# Patient Record
Sex: Female | Born: 1975 | Race: Black or African American | Hispanic: No | Marital: Single | Smoking: Never smoker
Health system: Southern US, Community
[De-identification: ages and names within clinical notes are randomized; demographics above are authoritative.]

## PROBLEM LIST (undated history)

## (undated) DIAGNOSIS — IMO0001 Reserved for inherently not codable concepts without codable children: Secondary | ICD-10-CM

---

## 2011-04-09 ENCOUNTER — Inpatient Hospital Stay (INDEPENDENT_AMBULATORY_CARE_PROVIDER_SITE_OTHER)
Admission: RE | Admit: 2011-04-09 | Discharge: 2011-04-09 | Disposition: A | Discharge status: Home / Self Care | Payer: Self-pay | Source: Ambulatory Visit | Attending: Family Medicine | Admitting: Family Medicine

## 2011-04-09 DIAGNOSIS — T148 Other injury of unspecified body region: Secondary | ICD-10-CM

## 2011-07-03 ENCOUNTER — Inpatient Hospital Stay (INDEPENDENT_AMBULATORY_CARE_PROVIDER_SITE_OTHER)
Admission: RE | Admit: 2011-07-03 | Discharge: 2011-07-03 | Disposition: A | Discharge status: Home / Self Care | Payer: Self-pay | Source: Ambulatory Visit | Attending: Emergency Medicine | Admitting: Emergency Medicine

## 2011-07-03 DIAGNOSIS — N949 Unspecified condition associated with female genital organs and menstrual cycle: Secondary | ICD-10-CM

## 2011-07-03 LAB — POCT URINALYSIS DIP (DEVICE)
Bilirubin Urine: NEGATIVE
Ketones, ur: NEGATIVE mg/dL
Nitrite: NEGATIVE
Protein, ur: NEGATIVE mg/dL
pH: 7 (ref 5.0–8.0)

## 2011-07-03 LAB — POCT PREGNANCY, URINE: Preg Test, Ur: NEGATIVE

## 2011-07-03 LAB — WET PREP, GENITAL: Trich, Wet Prep: NONE SEEN

## 2011-07-04 LAB — GC/CHLAMYDIA PROBE AMP, GENITAL: Chlamydia, DNA Probe: NEGATIVE

## 2011-08-02 ENCOUNTER — Inpatient Hospital Stay (INDEPENDENT_AMBULATORY_CARE_PROVIDER_SITE_OTHER)
Admission: RE | Admit: 2011-08-02 | Discharge: 2011-08-02 | Disposition: A | Discharge status: Home / Self Care | Payer: Self-pay | Source: Ambulatory Visit | Attending: Family Medicine | Admitting: Family Medicine

## 2011-08-02 DIAGNOSIS — L259 Unspecified contact dermatitis, unspecified cause: Secondary | ICD-10-CM

## 2012-02-15 ENCOUNTER — Encounter (HOSPITAL_COMMUNITY): Payer: Self-pay | Admitting: Emergency Medicine

## 2012-02-15 ENCOUNTER — Emergency Department (HOSPITAL_COMMUNITY)
Admission: EM | Admit: 2012-02-15 | Discharge: 2012-02-15 | Disposition: A | Discharge status: Home Health | Payer: Self-pay | Attending: Emergency Medicine | Admitting: Emergency Medicine

## 2012-02-15 DIAGNOSIS — R51 Headache: Secondary | ICD-10-CM | POA: Insufficient documentation

## 2012-02-15 DIAGNOSIS — J3489 Other specified disorders of nose and nasal sinuses: Secondary | ICD-10-CM | POA: Insufficient documentation

## 2012-02-15 DIAGNOSIS — IMO0001 Reserved for inherently not codable concepts without codable children: Secondary | ICD-10-CM | POA: Insufficient documentation

## 2012-02-15 DIAGNOSIS — J0100 Acute maxillary sinusitis, unspecified: Secondary | ICD-10-CM | POA: Insufficient documentation

## 2012-02-15 HISTORY — DX: Reserved for inherently not codable concepts without codable children: IMO0001

## 2012-02-15 MED ORDER — TRAMADOL HCL 50 MG PO TABS: 50.0000 mg | ORAL_TABLET | Freq: Four times a day (QID) | ORAL | Status: AC | PRN

## 2012-02-15 MED ORDER — IBUPROFEN 800 MG PO TABS
800.0000 mg | ORAL_TABLET | Freq: Once | ORAL | Status: AC
  Administered 2012-02-15: 800 mg via ORAL
  Filled 2012-02-15: qty 1

## 2012-02-15 MED ORDER — IBUPROFEN 600 MG PO TABS: 600.0000 mg | ORAL_TABLET | Freq: Four times a day (QID) | ORAL | Status: AC | PRN

## 2012-02-15 MED ORDER — MOMETASONE FUROATE 50 MCG/ACT NA SUSP: 2.0000 | Freq: Every day | NASAL | Status: DC

## 2012-02-15 MED ORDER — OXYMETAZOLINE HCL 0.05 % NA SOLN
1.0000 | Freq: Once | NASAL | Status: AC
  Administered 2012-02-15: 1 via NASAL
  Filled 2012-02-15: qty 15

## 2012-02-15 NOTE — ED Notes (Signed)
Patient complaining of right facial pain and sinus pressure, with the pain extending into her right ear.  Patient states that she has had recent cold symptoms and sinus pressure the last few days, and that the right sided facial pain and ear ache became worse today.

## 2012-02-15 NOTE — ED Notes (Signed)
Patient is AOx4 and comfortable with her discharge instructions. 

## 2012-02-15 NOTE — Discharge Instructions (Signed)
Take Motrin (maximum of 2400mg  per day) or tylenol (maximum of 4000mg  per day) scheduled for 2 days then as needed for pain.  If your pain persists, take Tramadol.  Use nasonex daily and Afrin up to twice daily for 3 days; however, do not use Afrin for longer than 3 days.     Sinusitis Sinuses are air pockets within the bones of your face. The growth of bacteria within a sinus leads to infection. The infection prevents the sinuses from draining. This infection is called sinusitis. SYMPTOMS  There will be different areas of pain depending on which sinuses have become infected.  The maxillary sinuses often produce pain beneath the eyes.   Frontal sinusitis may cause pain in the middle of the forehead and above the eyes.  Other problems (symptoms) include:  Toothaches.   Colored, pus-like (purulent) drainage from the nose.   Swelling, warmth, and tenderness over the sinus areas may be signs of infection.  TREATMENT  Sinusitis is most often determined by an exam.X-rays may be taken. If x-rays have been taken, make sure you obtain your results or find out how you are to obtain them. Your caregiver may give you medications (antibiotics). These are medications that will help kill the bacteria causing the infection. You may also be given a medication (decongestant) that helps to reduce sinus swelling.  HOME CARE INSTRUCTIONS   Only take over-the-counter or prescription medicines for pain, discomfort, or fever as directed by your caregiver.   Drink extra fluids. Fluids help thin the mucus so your sinuses can drain more easily.   Applying either moist heat or ice packs to the sinus areas may help relieve discomfort.   Use saline nasal sprays to help moisten your sinuses. The sprays can be found at your local drugstore.  SEEK IMMEDIATE MEDICAL CARE IF:  You have a fever.   You have increasing pain, severe headaches, or toothache.   You have nausea, vomiting, or drowsiness.   You develop  unusual swelling around the face or trouble seeing.  MAKE SURE YOU:   Understand these instructions.   Will watch your condition.   Will get help right away if you are not doing well or get worse.  Document Released: 12/12/2005 Document Revised: 08/24/2011 Document Reviewed: 07/11/2007 [facility] Patient Information 2012 [place], [place].

## 2012-02-15 NOTE — ED Provider Notes (Signed)
History     CSN: [id number]  Arrival date & time 02/15/12  2020   First MD Initiated Contact with Patient 02/15/12 2148      Chief Complaint  Patient presents with  . Facial Pain    (Consider location/radiation/quality/duration/timing/severity/associated sxs/prior treatment) HPI History provided by the patient.  36 year old female presenting with complaint of right-sided face pain.  Patient states that her pain began yesterday morning and has been gradual in onset, constant, and moderate to severe. Pain is located only on the right side of her face and extends back to around her right ear.  Pain is described as throbbing with occasional sharp sensation.  Patient reports mild improvement with over-the-counter cough and cold medications. Patient reports associated mild right-sided sinus pressure which has largely resolved prior to ED presentation. No associated rash, fever, vision change, hearing change, sore throat, rhinorrhea, or cough. Patient reports that she has a history of tension headaches but this is different both in character and location, as her headaches are generally bilateral frontal.  Pain is also somewhat different than prior sinusitis.    Patient has not been seen recently for her current complaints, and patient does not have a PCP.     Past Medical History  Diagnosis Date  . No significant past medical history     History reviewed. No pertinent past surgical history.  History reviewed. No pertinent family history.  History  Substance Use Topics  . Smoking status: Never Smoker   . Smokeless tobacco: Not on file  . Alcohol Use: No    OB History    Grav Para Term Preterm Abortions TAB SAB Ect Mult Living                  Review of Systems  Constitutional: Negative for fever and chills.  HENT: Positive for ear pain (Rt-sided) and sinus pressure (Rt). Negative for hearing loss, congestion, sore throat, rhinorrhea, trouble swallowing, neck pain and dental  problem.   Eyes: Negative for photophobia, pain and visual disturbance.  Respiratory: Negative for cough, shortness of breath and wheezing.   Cardiovascular: Negative for chest pain and palpitations.  Gastrointestinal: Negative for nausea, vomiting, abdominal pain, diarrhea and blood in stool.  Genitourinary: Negative for dysuria and hematuria.  Musculoskeletal: Negative for back pain and gait problem.  Skin: Negative for rash and wound.  Neurological: Positive for headaches. Negative for dizziness, facial asymmetry, speech difficulty, weakness and numbness.  Psychiatric/Behavioral: Negative for confusion and agitation.  All other systems reviewed and are negative.    Allergies  Review of patient's allergies indicates no known allergies.  Home Medications  No current outpatient prescriptions on file.  BP 154/97  Pulse 96  Temp(Src) 98.2 F (36.8 C) (Oral)  Resp 20  SpO2 100%  Physical Exam  Nursing note and vitals reviewed. Constitutional: She is oriented to person, place, and time. She appears well-developed and well-nourished.       Appears uncomfortable but in no acute distress  HENT:  Head: Normocephalic and atraumatic.  Right Ear: External ear normal.  Left Ear: External ear normal.  Nose: Nose normal.  Mouth/Throat: Oropharynx is clear and moist.       Normal TMs bilaterally, no mastoid tenderness, and no pain elicited with movement of the right auricle, no rash or sign of shingles, no significantly abnormal tonsils or exudate. No dental abscess or sig dental TTP.  Eyes: Conjunctivae and EOM are normal. Pupils are equal, round, and reactive to light.  Neck: Normal  range of motion. Neck supple.  Cardiovascular: Normal rate, regular rhythm and intact distal pulses.   No murmur heard. Pulmonary/Chest: Effort normal and breath sounds normal. No respiratory distress.  Abdominal: Soft. Bowel sounds are normal. There is no tenderness.  Musculoskeletal: Normal range of  motion. She exhibits no edema.  Neurological: She is alert and oriented to person, place, and time.  Skin: Skin is warm and dry. No rash noted. She is not diaphoretic.  Psychiatric: She has a normal mood and affect. Judgment normal.    ED Course  Procedures (including critical care time)  Labs Reviewed - No data to display No results found.   1. Maxillary sinusitis, acute       MDM  36 year old female presenting with complaint of right-sided face to head pain.  Exam as above.  Doubt glaucoma, dental etiology, trigeminal neuralgia, shingles, AOM or otitis externa, temporal arteritis, or mastoiditis.  No h/o migraines.  Right maxillary sinusitis probable.  Will treat with Afrin, nasonex, and pain meds.  Return precautions reviewed.      Particia Lather, MD 02/16/12 [phone]

## 2012-02-16 NOTE — ED Notes (Signed)
[facility] pharmacy states pt says nasonex is too expensive; ok to substitute flonase spray one unit, 2 sprays each nostril every day for 3-5 days per bowie tran, pa-c.

## 2012-02-16 NOTE — ED Provider Notes (Signed)
I saw and evaluated the patient, reviewed the resident's note and I agree with the findings and plan.   Loren Racer, MD 02/16/12 1600

## 2012-06-07 ENCOUNTER — Encounter (HOSPITAL_COMMUNITY): Payer: Self-pay | Admitting: Emergency Medicine

## 2012-06-07 ENCOUNTER — Emergency Department (HOSPITAL_COMMUNITY)
Admission: EM | Admit: 2012-06-07 | Discharge: 2012-06-07 | Disposition: A | Discharge status: Home / Self Care | Payer: Self-pay | Attending: Emergency Medicine | Admitting: Emergency Medicine

## 2012-06-07 DIAGNOSIS — J320 Chronic maxillary sinusitis: Secondary | ICD-10-CM | POA: Insufficient documentation

## 2012-06-07 DIAGNOSIS — J329 Chronic sinusitis, unspecified: Secondary | ICD-10-CM

## 2012-06-07 MED ORDER — OXYMETAZOLINE HCL 0.05 % NA SOLN: 2.0000 | Freq: Two times a day (BID) | NASAL | Status: AC

## 2012-06-07 MED ORDER — HYDROCODONE-ACETAMINOPHEN 5-325 MG PO TABS: 1.0000 | ORAL_TABLET | ORAL | Status: AC | PRN

## 2012-06-07 MED ORDER — IBUPROFEN 800 MG PO TABS
800.0000 mg | ORAL_TABLET | Freq: Once | ORAL | Status: AC
  Administered 2012-06-07: 800 mg via ORAL

## 2012-06-07 MED ORDER — CEPHALEXIN 500 MG PO CAPS: 500.0000 mg | ORAL_CAPSULE | Freq: Four times a day (QID) | ORAL | Status: AC

## 2012-06-07 MED ORDER — IBUPROFEN 800 MG PO TABS
ORAL_TABLET | ORAL | Status: AC
  Filled 2012-06-07: qty 1

## 2012-06-07 NOTE — ED Provider Notes (Signed)
History     CSN: [id number]  Arrival date & time 06/07/12  0009   First MD Initiated Contact with Patient 06/07/12 0058      Chief Complaint  Patient presents with  . Facial Pain    (Consider location/radiation/quality/duration/timing/severity/associated sxs/prior treatment) HPI Comments: Patient here with recurrent right sided facial pain - states pain starts in her right ear and radiates to right maxillary sinus and into upper jaw.  States she was seen in February for this, placed on nasonex and pain medication which did not help.  She has not seen a PCP or ENT for this.  Denies purulent drainage, reports pain increases with lying flat, denies fever, chills, nausea, vomiting.  Patient is a 36 y.o. female presenting with tooth pain. The history is provided by the patient. No language interpreter was used.  Dental PainThe primary symptoms include mouth pain. Primary symptoms do not include dental injury, oral bleeding, oral lesions, headaches, fever, shortness of breath, sore throat, angioedema or cough. The symptoms began more than 1 week ago. The symptoms are worsening. The symptoms are new. The symptoms occur constantly.  Additional symptoms include: jaw pain and ear pain. Additional symptoms do not include: dental sensitivity to temperature, gum swelling, gum tenderness, purulent gums, trismus, facial swelling, trouble swallowing, pain with swallowing, excessive salivation, dry mouth, taste disturbance, smell disturbance, drooling, hearing loss, nosebleeds, swollen glands, goiter and fatigue.    Past Medical History  Diagnosis Date  . No significant past medical history     History reviewed. No pertinent past surgical history.  No family history on file.  History  Substance Use Topics  . Smoking status: Never Smoker   . Smokeless tobacco: Not on file  . Alcohol Use: No    OB History    Grav Para Term Preterm Abortions TAB SAB Ect Mult Living                  Review of  Systems  Constitutional: Negative for fever and fatigue.  HENT: Positive for ear pain and sinus pressure. Negative for hearing loss, nosebleeds, sore throat, facial swelling, rhinorrhea, drooling and trouble swallowing.   Respiratory: Negative for cough and shortness of breath.   Neurological: Negative for headaches.  All other systems reviewed and are negative.    Allergies  Review of patient's allergies indicates no known allergies.  Home Medications   Current Outpatient Rx  Name Route Sig Dispense Refill  . IBUPROFEN 800 MG PO TABS Oral Take 800 mg by mouth every 8 (eight) hours as needed.    . MOMETASONE FUROATE 50 MCG/ACT NA SUSP Nasal Place 2 sprays into the nose daily. 17 g 0  . OVER THE COUNTER MEDICATION Oral Take 1 tablet by mouth every 4 (four) hours as needed. OTC allergy medication    . OVER THE COUNTER MEDICATION Oral Take 1 tablet by mouth daily as needed. excedrin-type product OTC      BP 139/96  Pulse 96  Temp 98.4 F (36.9 C) (Oral)  Resp 16  SpO2 96%  Physical Exam  Nursing note and vitals reviewed. Constitutional: She is oriented to person, place, and time. She appears well-developed and well-nourished. No distress.  HENT:  Head: Normocephalic and atraumatic.  Right Ear: External ear normal.  Left Ear: External ear normal.  Mouth/Throat: Oropharynx is clear and moist. No oropharyngeal exudate.       Boggy nasal mucosa - ttp to right maxillary sinus, no rash or lesions noted.  Eyes: Conjunctivae  are normal. Pupils are equal, round, and reactive to light. No scleral icterus.  Neck: Normal range of motion. Neck supple.  Cardiovascular: Normal rate, regular rhythm and normal heart sounds.  Exam reveals no gallop and no friction rub.   No murmur heard. Pulmonary/Chest: Effort normal and breath sounds normal. No respiratory distress. She has no wheezes. She has no rales. She exhibits no tenderness.  Abdominal: Soft. Bowel sounds are normal. She exhibits no  distension. There is no tenderness.  Musculoskeletal: Normal range of motion. She exhibits no edema and no tenderness.  Lymphadenopathy:    She has no cervical adenopathy.  Neurological: She is alert and oriented to person, place, and time. No cranial nerve deficit.  Skin: Skin is warm and dry. No rash noted. No erythema. No pallor.  Psychiatric: She has a normal mood and affect. Her behavior is normal. Judgment and thought content normal.    ED Course  Procedures (including critical care time)  Labs Reviewed - No data to display No results found.   Maxillary sinusitis   MDM  Patient with history of maxillary sinusitis here with similar symptoms.  Will place on short course of pain medication, afrin nasal spray and abx - patient will follow up with Dr. Lazarus Salines if needed.        Izola Price [place], [place] 06/07/12 0122

## 2012-06-07 NOTE — ED Provider Notes (Signed)
Medical screening examination/treatment/procedure(s) were performed by non-physician practitioner and as supervising physician I was immediately available for consultation/collaboration.  Jasmine Awe, MD 06/07/12 [phone]

## 2012-06-07 NOTE — ED Notes (Signed)
Pt. Alert and oriented, discharged to home

## 2012-06-07 NOTE — Discharge Instructions (Signed)

## 2012-06-07 NOTE — ED Notes (Signed)
C/o sinus pressure x 2 weeks.  Taking multiple OTC medications without relief.

## 2012-06-07 NOTE — ED Notes (Signed)
Received pt. From triage pt. Alert and oriented, NAD noted 

## 2012-12-24 ENCOUNTER — Emergency Department (HOSPITAL_COMMUNITY)
Admission: EM | Admit: 2012-12-24 | Discharge: 2012-12-25 | Disposition: A | Discharge status: Home / Self Care | Payer: Self-pay | Attending: Emergency Medicine | Admitting: Emergency Medicine

## 2012-12-24 ENCOUNTER — Encounter (HOSPITAL_COMMUNITY): Payer: Self-pay | Admitting: *Deleted

## 2012-12-24 DIAGNOSIS — R05 Cough: Secondary | ICD-10-CM | POA: Insufficient documentation

## 2012-12-24 DIAGNOSIS — R5381 Other malaise: Secondary | ICD-10-CM | POA: Insufficient documentation

## 2012-12-24 DIAGNOSIS — R059 Cough, unspecified: Secondary | ICD-10-CM | POA: Insufficient documentation

## 2012-12-24 DIAGNOSIS — R52 Pain, unspecified: Secondary | ICD-10-CM | POA: Insufficient documentation

## 2012-12-24 DIAGNOSIS — R509 Fever, unspecified: Secondary | ICD-10-CM | POA: Insufficient documentation

## 2012-12-24 DIAGNOSIS — J3489 Other specified disorders of nose and nasal sinuses: Secondary | ICD-10-CM | POA: Insufficient documentation

## 2012-12-24 DIAGNOSIS — J069 Acute upper respiratory infection, unspecified: Secondary | ICD-10-CM | POA: Insufficient documentation

## 2012-12-24 NOTE — ED Notes (Signed)
The pt has headache all day with body aches and a temp.  She took tylenol 650mg  po  ??? Dose of tablest

## 2012-12-25 MED ORDER — HYDROCODONE-ACETAMINOPHEN 5-325 MG PO TABS: 1.0000 | ORAL_TABLET | ORAL | Status: DC | PRN

## 2012-12-25 NOTE — ED Notes (Signed)
Pt reports being around her niece Saturday who sick with nasal congestion and cough

## 2012-12-25 NOTE — ED Provider Notes (Signed)
History     CSN: [id number]  Arrival date & time 12/24/12  2211   First MD Initiated Contact with Patient 12/25/12 0004      Chief Complaint  Patient presents with  . Headache    (Consider location/radiation/quality/duration/timing/severity/associated sxs/prior treatment) HPI History provided by pt.   Pt presents w/ 2 days of cough and fever.  Associated w/ frontal headache, nasal congestion, body aches, generalized weakness and chills.  Denies CP, SOB, sore throat and N/V/D.  Known sick contacts.  Has taken tylenol w/ some relief.  No PMH.  Past Medical History  Diagnosis Date  . No significant past medical history     History reviewed. No pertinent past surgical history.  No family history on file.  History  Substance Use Topics  . Smoking status: Never Smoker   . Smokeless tobacco: Not on file  . Alcohol Use: No    OB History    Grav Para Term Preterm Abortions TAB SAB Ect Mult Living                  Review of Systems  All other systems reviewed and are negative.    Allergies  Review of patient's allergies indicates no known allergies.  Home Medications   Current Outpatient Rx  Name  Route  Sig  Dispense  Refill  . HYDROCODONE-ACETAMINOPHEN 5-325 MG PO TABS   Oral   Take 1 tablet by mouth every 4 (four) hours as needed for pain.   12 tablet   0   . IBUPROFEN 800 MG PO TABS   Oral   Take 800 mg by mouth every 8 (eight) hours as needed.         . MOMETASONE FUROATE 50 MCG/ACT NA SUSP   Nasal   Place 2 sprays into the nose daily.   17 g   0   . OVER THE COUNTER MEDICATION   Oral   Take 1 tablet by mouth every 4 (four) hours as needed. OTC allergy medication         . OVER THE COUNTER MEDICATION   Oral   Take 1 tablet by mouth daily as needed. excedrin-type product OTC           BP 126/86  Pulse 120  Temp 100.5 F (38.1 C) (Oral)  Resp 22  SpO2 96%  Physical Exam  Nursing note and vitals reviewed. Constitutional: She is  oriented to person, place, and time. She appears well-developed and well-nourished.  HENT:  Head: Normocephalic and atraumatic.       No tenderness of sinuses or temples.  Nasal congestion.  Nml posterior pharynx and tonsils.   Eyes:       Normal appearance  Neck: Normal range of motion. Neck supple. No rigidity. No Brudzinski's sign and no Kernig's sign noted.  Cardiovascular: Normal rate, regular rhythm and intact distal pulses.   Pulmonary/Chest: Effort normal and breath sounds normal.  Musculoskeletal: Normal range of motion.  Lymphadenopathy:    She has cervical adenopathy.  Neurological: She is alert and oriented to person, place, and time. No sensory deficit. Coordination normal.       CN 3-12 intact.  No nystagmus.  5/5 and equal upper and lower extremity strength.  No past pointing.    Skin: Skin is warm and dry. No rash noted.  Psychiatric: She has a normal mood and affect. Her behavior is normal.    ED Course  Procedures (including critical care time)  Labs Reviewed -  No data to display No results found.   1. Viral URI with cough       MDM  36yo healthy F presents w/ s/sx most consistent w/ viral URI.  Currently afebrile, VS w/in nml range, no respiratory distress or focal neuro deficits.  Will treat symptomatically; prescribed vicodin for pain/cough suppression and recommended ibuprofen, sudafed, rest and fluids.  Return precautions discussed.         Otilio Miu, PA-C 12/25/12 [phone]

## 2012-12-25 NOTE — ED Provider Notes (Signed)
Medical screening examination/treatment/procedure(s) were performed by non-physician practitioner and as supervising physician I was immediately available for consultation/collaboration.  Sunnie Nielsen, MD 12/25/12 [phone]

## 2013-03-06 ENCOUNTER — Emergency Department (INDEPENDENT_AMBULATORY_CARE_PROVIDER_SITE_OTHER)
Admission: EM | Admit: 2013-03-06 | Discharge: 2013-03-06 | Disposition: A | Discharge status: Home / Self Care | Payer: Self-pay | Source: Home / Self Care

## 2013-03-06 ENCOUNTER — Encounter (HOSPITAL_COMMUNITY): Payer: Self-pay | Admitting: *Deleted

## 2013-03-06 LAB — POCT PREGNANCY, URINE: Preg Test, Ur: NEGATIVE

## 2013-03-06 MED ORDER — CEPHALEXIN 500 MG PO CAPS: 500.0000 mg | ORAL_CAPSULE | Freq: Four times a day (QID) | ORAL | Status: DC

## 2013-03-06 MED ORDER — FLUCONAZOLE 150 MG PO TABS: ORAL_TABLET | ORAL | Status: DC

## 2013-03-06 MED ORDER — HYDROCODONE-ACETAMINOPHEN 5-325 MG PO TABS: 1.0000 | ORAL_TABLET | ORAL | Status: DC | PRN

## 2013-03-06 NOTE — ED Notes (Signed)
C/o hematuria onset Monday.  Also has burning and frequency of urination.  C/o itching on her labia and some on the inside of her vagina.  C/o lower abd. cramping onset Monday night.  Aching in her back and all over on Tuesday with a sore throat.  States her sinuses are draining since Monday night and Tuesday.

## 2013-03-06 NOTE — ED Provider Notes (Signed)
History     CSN: [id number]  Arrival date & time 03/06/13  1858   None     Chief Complaint  Patient presents with  . Hematuria    (Consider location/radiation/quality/duration/timing/severity/associated sxs/prior treatment) HPI Comments: 37 year old female noticed blood in the urine 48 hours ago. She then experienced dysuria, suprapubic discomfort and urinary frequency. In addition she has developed a sharp pain that radiates from the right flank toward the right lower abdomen. It is intermittent and then described as a dull ache alternating with sharp.. denies fever or other constitutional symptoms. Also complaining of PND with mild sore throat.  Patient is a 37 y.o. female presenting with hematuria.  Hematuria Associated symptoms include abdominal pain.    Past Medical History  Diagnosis Date  . No significant past medical history     History reviewed. No pertinent past surgical history.  History reviewed. No pertinent family history.  History  Substance Use Topics  . Smoking status: Never Smoker   . Smokeless tobacco: Not on file  . Alcohol Use: No    OB History   Grav Para Term Preterm Abortions TAB SAB Ect Mult Living                  Review of Systems  Constitutional: Positive for activity change. Negative for fever and fatigue.  Respiratory: Negative.   Cardiovascular: Negative.   Gastrointestinal: Positive for abdominal pain. Negative for nausea.  Genitourinary: Positive for dysuria, frequency, hematuria and flank pain.  Musculoskeletal: Positive for back pain.  Skin: Negative.   Neurological: Negative.     Allergies  Review of patient's allergies indicates no known allergies.  Home Medications   Current Outpatient Rx  Name  Route  Sig  Dispense  Refill  . OVER THE COUNTER MEDICATION   Oral   Take 1 tablet by mouth every 4 (four) hours as needed. OTC allergy medication         . cephALEXin (KEFLEX) 500 MG capsule   Oral   Take 1 capsule  (500 mg total) by mouth 4 (four) times daily.   28 capsule   0   . fluconazole (DIFLUCAN) 150 MG tablet      1 tab po x 1. May repeat in 72 hours if no improvement   2 tablet   0   . HYDROcodone-acetaminophen (NORCO/VICODIN) 5-325 MG per tablet   Oral   Take 1 tablet by mouth every 4 (four) hours as needed for pain.   12 tablet   0   . HYDROcodone-acetaminophen (NORCO/VICODIN) 5-325 MG per tablet   Oral   Take 1 tablet by mouth every 4 (four) hours as needed for pain.   15 tablet   0   . ibuprofen (ADVIL,MOTRIN) 800 MG tablet   Oral   Take 800 mg by mouth every 8 (eight) hours as needed.         . mometasone (NASONEX) 50 MCG/ACT nasal spray   Nasal   Place 2 sprays into the nose daily.   17 g   0   . OVER THE COUNTER MEDICATION   Oral   Take 1 tablet by mouth daily as needed. excedrin-type product OTC           BP 115/74  Pulse 107  Temp(Src) 98.8 F (37.1 C) (Oral)  Resp 18  SpO2 99%  Physical Exam  Nursing note and vitals reviewed. Constitutional: She is oriented to person, place, and time. She appears well-developed and well-nourished. No distress.  HENT:  Right Ear: External ear normal.  Left Ear: External ear normal.  Minor erythema with clear PND in the oropharynx.  Neck: Neck supple.  Cardiovascular: Normal rate and normal heart sounds.   Pulmonary/Chest: Effort normal.  Abdominal: Soft. She exhibits no distension and no mass. There is no tenderness. There is no rebound and no guarding.  Musculoskeletal: Normal range of motion.  Neurological: She is alert and oriented to person, place, and time.  Skin: Skin is warm and dry.  Psychiatric: She has a normal mood and affect.    ED Course  Procedures (including crit she is a third year resident the ical care time)  Labs Reviewed  POCT PREGNANCY, URINE   No results found.  Results for orders placed during the hospital encounter of 03/06/13  POCT PREGNANCY, URINE      Result Value Range    Preg Test, Ur NEGATIVE  NEGATIVE      1. UTI (lower urinary tract infection)   2. Flank pain   3. PND (post-nasal drip)       MDM  Norco 5 mg every 4-6 hours when necessary pain . Diflucan 150 mg one now and repeat in 48 hours need to obtain a primary care doctor for followup. It was discussed in detail regarding the possibility of having a kidney stone.   Her symptoms of right flank pain radiating to the right lateral abdomen and UTI symptoms suggest that this would be included in the differential diagnosis. If she continues to have this discomfort within 48 hours she  the e he is to go to emergency department. If he gets worse or she has increased urinary  bleeding she is to go to emergency department. She is aware that this needs to be diagnosed and may have to have a CT exam.   Keflex 500 mg 4 times a day for 7 days  urinalysis was negative for nitrites, leukocytes, blood. Elements were within normal limits.     Hayden Rasmussen, NP 03/06/13 2110

## 2013-03-07 ENCOUNTER — Emergency Department (HOSPITAL_COMMUNITY)
Admission: EM | Admit: 2013-03-07 | Discharge: 2013-03-07 | Disposition: A | Discharge status: Home / Self Care | Payer: Self-pay | Attending: Emergency Medicine | Admitting: Emergency Medicine

## 2013-03-07 ENCOUNTER — Emergency Department (HOSPITAL_COMMUNITY): Payer: Self-pay

## 2013-03-07 DIAGNOSIS — R319 Hematuria, unspecified: Secondary | ICD-10-CM

## 2013-03-07 DIAGNOSIS — R109 Unspecified abdominal pain: Secondary | ICD-10-CM

## 2013-03-07 DIAGNOSIS — N730 Acute parametritis and pelvic cellulitis: Secondary | ICD-10-CM

## 2013-03-07 DIAGNOSIS — N739 Female pelvic inflammatory disease, unspecified: Secondary | ICD-10-CM | POA: Insufficient documentation

## 2013-03-07 DIAGNOSIS — Z79899 Other long term (current) drug therapy: Secondary | ICD-10-CM | POA: Insufficient documentation

## 2013-03-07 DIAGNOSIS — Z3202 Encounter for pregnancy test, result negative: Secondary | ICD-10-CM | POA: Insufficient documentation

## 2013-03-07 LAB — URINALYSIS, MICROSCOPIC ONLY
Bilirubin Urine: NEGATIVE
Nitrite: NEGATIVE
Specific Gravity, Urine: 1.002 — ABNORMAL LOW (ref 1.005–1.030)
Urobilinogen, UA: 0.2 mg/dL (ref 0.0–1.0)
pH: 7 (ref 5.0–8.0)

## 2013-03-07 LAB — CBC WITH DIFFERENTIAL/PLATELET
Basophils Absolute: 0 10*3/uL (ref 0.0–0.1)
Basophils Relative: 0 % (ref 0–1)
Eosinophils Absolute: 0.1 10*3/uL (ref 0.0–0.7)
Eosinophils Relative: 1 % (ref 0–5)
MCH: 27.8 pg (ref 26.0–34.0)
MCHC: 36 g/dL (ref 30.0–36.0)
MCV: 77.1 fL — ABNORMAL LOW (ref 78.0–100.0)
Neutrophils Relative %: 72 % (ref 43–77)
Platelets: 256 10*3/uL (ref 150–400)
RDW: 14.3 % (ref 11.5–15.5)

## 2013-03-07 LAB — WET PREP, GENITAL: Yeast Wet Prep HPF POC: NONE SEEN

## 2013-03-07 LAB — BASIC METABOLIC PANEL
Calcium: 9.5 mg/dL (ref 8.4–10.5)
GFR calc Af Amer: >90 mL/min (ref >90)
GFR calc non Af Amer: >90 mL/min (ref >90)
Glucose, Bld: 83 mg/dL (ref 70–99)
Potassium: 3.6 mEq/L (ref 3.5–5.1)
Sodium: 137 mEq/L (ref 135–145)

## 2013-03-07 LAB — POCT PREGNANCY, URINE: Preg Test, Ur: NEGATIVE

## 2013-03-07 MED ORDER — CEFTRIAXONE SODIUM 250 MG IJ SOLR
250.0000 mg | Freq: Once | INTRAMUSCULAR | Status: AC
  Administered 2013-03-07: 250 mg via INTRAMUSCULAR
  Filled 2013-03-07: qty 250

## 2013-03-07 MED ORDER — METRONIDAZOLE 500 MG PO TABS: 500.0000 mg | ORAL_TABLET | Freq: Three times a day (TID) | ORAL | Status: DC

## 2013-03-07 MED ORDER — LIDOCAINE HCL (PF) 1 % IJ SOLN
INTRAMUSCULAR | Status: AC
  Administered 2013-03-07: 1 mL
  Filled 2013-03-07: qty 5

## 2013-03-07 MED ORDER — ACETAMINOPHEN 500 MG PO TABS
1000.0000 mg | ORAL_TABLET | Freq: Once | ORAL | Status: AC
  Administered 2013-03-07: 1000 mg via ORAL
  Filled 2013-03-07: qty 2

## 2013-03-07 MED ORDER — DOXYCYCLINE HYCLATE 100 MG PO CAPS: 100.0000 mg | ORAL_CAPSULE | Freq: Two times a day (BID) | ORAL | Status: DC

## 2013-03-07 MED ORDER — METRONIDAZOLE 500 MG PO TABS
500.0000 mg | ORAL_TABLET | Freq: Once | ORAL | Status: AC
  Administered 2013-03-07: 500 mg via ORAL
  Filled 2013-03-07: qty 1

## 2013-03-07 MED ORDER — DOXYCYCLINE HYCLATE 100 MG PO TABS
100.0000 mg | ORAL_TABLET | Freq: Once | ORAL | Status: AC
  Administered 2013-03-07: 100 mg via ORAL
  Filled 2013-03-07: qty 1

## 2013-03-07 MED ORDER — OXYCODONE-ACETAMINOPHEN 5-325 MG PO TABS: ORAL_TABLET | ORAL | Status: DC

## 2013-03-07 NOTE — ED Notes (Signed)
Pt states that on Monday she began feeling pain upon urination which has increasingly worsened.  Pt states that she has seen blood upon urination and she feels that she is urinating razor blades.

## 2013-03-07 NOTE — ED Notes (Addendum)
Presents with right sided flank pain, lower back pain that radiates from back to lower right abdomen. Pain began Monday and is described as sharp, and intermittent. Reports [pressure in lower abdomen and painful urination. Was seen at [facility] yesterday and told to come here if pain persisits. Pt is concerned because she has merena and is worried it may be that causing the pain. Denies vaginal discharge, bleeding. Reports foul vaginal odor. Unknown last period due to [facility].

## 2013-03-07 NOTE — ED Notes (Signed)
Patient transported to CT 

## 2013-03-07 NOTE — ED Provider Notes (Signed)
History     CSN: [id number]  Arrival date & time 03/07/13  1640   First MD Initiated Contact with Patient 03/07/13 1958      Chief Complaint  Patient presents with  . Flank Pain    (Consider location/radiation/quality/duration/timing/severity/associated sxs/prior treatment) HPI  Suzanne Ramos is a 37 y.o. female complaining of Dysuria starting 4 days ago turning into gross hematuria 4 days ago and severe right flank pain that radiates to the right lower quadrant. Pain is described as severe, no exacerbating or alleviating factors identified. She endorses nausea. Denies fever or emesis. Patient denies vaginal discharge. 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     Past Medical History  Diagnosis Date  . No significant past medical history     No past surgical history on file.  No family history on file.  History  Substance Use Topics  . Smoking status: Never Smoker   . Smokeless tobacco: Not on file  . Alcohol Use: No    OB History   Grav Para Term Preterm Abortions TAB SAB Ect Mult Living                  Review of Systems  Constitutional: Negative for fever.  Respiratory: Negative for shortness of breath.   Cardiovascular: Negative for chest pain.  Gastrointestinal: Negative for nausea, vomiting, abdominal pain and diarrhea.  Genitourinary: Positive for hematuria and flank pain.  All other systems reviewed and are negative.    Allergies  Review of patient's allergies indicates no known allergies.  Home Medications   Current Outpatient Rx  Name  Route  Sig  Dispense  Refill  . cephALEXin (KEFLEX) 500 MG capsule   Oral   Take 1 capsule (500 mg total) by mouth 4 (four) times daily.   28 capsule   0   . fluconazole (DIFLUCAN) 150 MG tablet      1 tab po x 1. May repeat in 72 hours if no improvement   2 tablet   0   . HYDROcodone-acetaminophen (NORCO/VICODIN) 5-325 MG per tablet   Oral   Take 1  tablet by mouth every 4 (four) hours as needed for pain.   12 tablet   0   . HYDROcodone-acetaminophen (NORCO/VICODIN) 5-325 MG per tablet   Oral   Take 1 tablet by mouth every 4 (four) hours as needed for pain.   15 tablet   0   . ibuprofen (ADVIL,MOTRIN) 800 MG tablet   Oral   Take 800 mg by mouth every 8 (eight) hours as needed.         . mometasone (NASONEX) 50 MCG/ACT nasal spray   Nasal   Place 2 sprays into the nose daily.   17 g   0   . OVER THE COUNTER MEDICATION   Oral   Take 1 tablet by mouth every 4 (four) hours as needed. OTC allergy medication         . OVER THE COUNTER MEDICATION   Oral   Take 1 tablet by mouth daily as needed. excedrin-type product OTC           BP 128/84  Pulse 101  Temp(Src) 98.3 F (36.8 C) (Oral)  Resp 18  SpO2 97%  Physical Exam  Nursing note and vitals reviewed. Constitutional: She is oriented to person, place, and time. She appears well-developed and well-nourished. No distress.  HENT:  Head: Normocephalic.  Eyes: Conjunctivae and  EOM are normal.  Cardiovascular: Normal rate.   Pulmonary/Chest: Effort normal. No stridor.  Genitourinary: There is no tenderness on the left labia.  Pelvic exam chaperoned by technician. No rashes or lesions.  Thin white gray vaginal discharge foul-smelling. Minimal right adnexal tenderness. No cervical motion tenderness  Musculoskeletal: Normal range of motion.  Neurological: She is alert and oriented to person, place, and time.  Psychiatric: She has a normal mood and affect.    ED Course  Procedures (including critical care time)  Labs Reviewed  WET PREP, GENITAL - Abnormal; Notable for the following:    Clue Cells Wet Prep HPF POC MODERATE (*)    WBC, Wet Prep HPF POC MODERATE (*)    All other components within normal limits  URINALYSIS, MICROSCOPIC ONLY - Abnormal; Notable for the following:    Specific Gravity, Urine 1.002 (*)    Hgb urine dipstick LARGE (*)    Leukocytes,  UA SMALL (*)    Squamous Epithelial / LPF FEW (*)    All other components within normal limits  CBC WITH DIFFERENTIAL - Abnormal; Notable for the following:    WBC 14.8 (*)    HCT 34.7 (*)    MCV 77.1 (*)    Neutro Abs 10.7 (*)    All other components within normal limits  GC/CHLAMYDIA PROBE AMP  BASIC METABOLIC PANEL  POCT PREGNANCY, URINE   Ct Abdomen Pelvis Wo Contrast  03/07/2013  *RADIOLOGY REPORT*  Clinical Data: Right flank and right lower quadrant abdominal pain  CT ABDOMEN AND PELVIS WITHOUT CONTRAST  Technique:  Multidetector CT imaging of the abdomen and pelvis was performed following the standard protocol without intravenous contrast.  Comparison: None.  Findings: Limited images through the lung bases demonstrate no significant appreciable abnormality. The heart size is within normal limits. No pleural or pericardial effusion.  Organ abnormality/lesion detection is limited in the absence of intravenous contrast. Within this limitation, unremarkable liver, biliary system, spleen, pancreas, adrenal glands.  No perinephric fat stranding.  Subcentimeter hypodensity within the lower pole right kidney is favored to represent a cyst.  No hydronephrosis or hydroureter. Question duplication versus bifid pelvis on the right.  Unable to follow ureteral course in their entirety given the decompressed state.  No calcifications along the expected course identified.  No CT evidence for colitis.  Normal appendix.  Small bowel loops are normal course and caliber.  No free intraperitoneal air or fluid.  No lymphadenopathy.  Normal caliber aorta and branch vessels.  Circumferential bladder wall thickening.  There is stranding of the perivesicular fat, which also abuts the vagina.  IUD within the uterus.  No adnexal mass.  No acute osseous finding.  IMPRESSION: No hydronephrosis or ureteral calculi identified.  Normal appendix.  Fat stranding within the pelvis.  Correlate clinically for cystitis or PID.    Original Report Authenticated By: Jearld Lesch, M.D.      1. Hematuria   2. PID (acute pelvic inflammatory disease)   3. Right flank pain       MDM   Suzanne Ramos is a 37 y.o. female dysuria, hematuria and right flank pain. CT abdomen and pelvis stone protocol ordered.Analgesia options are limited as patient is driving home.  CT shows no hydronephrosis or ureteral  stones. They do note fat stranding within the pelvis. There is a mild right adnexal tenderness on bimanual exam, considering this with the moderate amount of white blood cells and affect she has an IUD in place I  will treat her for PID. I will also give her outpatient referral for IUD removal.  Patient will follow with urologist for evaluation of gross hematuria.   Filed Vitals:   03/07/13 1654  BP: 128/84  Pulse: 101  Temp: 98.3 F (36.8 C)  TempSrc: Oral  Resp: 18  SpO2: 97%     Pt verbalized understanding and agrees with care plan. Outpatient follow-up and return precautions given.    Discharge Medication List as of 03/07/2013 11:07 PM    START taking these medications   Details  doxycycline (VIBRAMYCIN) 100 MG capsule Take 1 capsule (100 mg total) by mouth 2 (two) times daily. One po bid x 14 days, Starting 03/07/2013, Until Discontinued, Print    metroNIDAZOLE (FLAGYL) 500 MG tablet Take 1 tablet (500 mg total) by mouth 3 (three) times daily., Starting 03/07/2013, Until Discontinued, Print              Wynetta Emery, PA-C 03/08/13 1750  Wynetta Emery, PA-C 03/08/13 1751

## 2013-03-08 ENCOUNTER — Telehealth (HOSPITAL_COMMUNITY): Payer: Self-pay | Admitting: Emergency Medicine

## 2013-03-08 LAB — GC/CHLAMYDIA PROBE AMP
CT Probe RNA: NEGATIVE
GC Probe RNA: NEGATIVE

## 2013-03-09 ENCOUNTER — Telehealth (HOSPITAL_COMMUNITY): Payer: Self-pay | Admitting: *Deleted

## 2013-03-09 NOTE — ED Provider Notes (Signed)
Medical screening examination/treatment/procedure(s) were performed by non-physician practitioner and as supervising physician I was immediately available for consultation/collaboration.   David Glick, MD 03/09/13 0620 

## 2013-03-12 NOTE — ED Provider Notes (Signed)
Medical screening examination/treatment/procedure(s) were performed by resident physician or non-physician practitioner and as supervising physician I was immediately available for consultation/collaboration.   KINDL,JAMES DOUGLAS MD.   James D Kindl, MD 03/12/13 1146 

## 2013-05-23 ENCOUNTER — Encounter: Payer: Self-pay | Admitting: Obstetrics & Gynecology

## 2013-06-20 ENCOUNTER — Encounter: Payer: Self-pay | Admitting: Obstetrics & Gynecology

## 2014-02-16 ENCOUNTER — Encounter (HOSPITAL_COMMUNITY): Payer: Self-pay | Admitting: Emergency Medicine

## 2014-02-16 ENCOUNTER — Emergency Department (INDEPENDENT_AMBULATORY_CARE_PROVIDER_SITE_OTHER)
Admission: EM | Admit: 2014-02-16 | Discharge: 2014-02-16 | Disposition: A | Discharge status: Home / Self Care | Payer: Self-pay | Source: Home / Self Care | Attending: Family Medicine | Admitting: Family Medicine

## 2014-02-16 DIAGNOSIS — J069 Acute upper respiratory infection, unspecified: Secondary | ICD-10-CM

## 2014-02-16 DIAGNOSIS — R300 Dysuria: Secondary | ICD-10-CM

## 2014-02-16 LAB — POCT PREGNANCY, URINE: PREG TEST UR: NEGATIVE

## 2014-02-16 LAB — POCT URINALYSIS DIP (DEVICE)
BILIRUBIN URINE: NEGATIVE
GLUCOSE, UA: NEGATIVE mg/dL
KETONES UR: NEGATIVE mg/dL
LEUKOCYTES UA: NEGATIVE
NITRITE: NEGATIVE
PH: 6.5 (ref 5.0–8.0)
Protein, ur: 30 mg/dL — AB
Specific Gravity, Urine: >=1.03 (ref 1.005–1.030)
Urobilinogen, UA: 0.2 mg/dL (ref 0.0–1.0)

## 2014-02-16 MED ORDER — CEPHALEXIN 500 MG PO CAPS: 500.0000 mg | ORAL_CAPSULE | Freq: Two times a day (BID) | ORAL | Status: DC

## 2014-02-16 MED ORDER — FLUCONAZOLE 150 MG PO TABS: 150.0000 mg | ORAL_TABLET | Freq: Once | ORAL | Status: DC

## 2014-02-16 NOTE — Discharge Instructions (Signed)
Thank you for coming in today. Take keflex twice daily for 1 week.  Use fluconazole as needed for yeast infection.  Take up to 2 aleve twice daily for sore throat, fever or pain.  Use Vaseline for your lips.  Urinary Tract Infection Urinary tract infections (UTIs) can develop anywhere along your urinary tract. Your urinary tract is your body's drainage system for removing wastes and extra water. Your urinary tract includes two kidneys, two ureters, a bladder, and a urethra. Your kidneys are a pair of bean-shaped organs. Each kidney is about the size of your fist. They are located below your ribs, one on each side of your spine. CAUSES Infections are caused by microbes, which are microscopic organisms, including fungi, viruses, and bacteria. These organisms are so small that they can only be seen through a microscope. Bacteria are the microbes that most commonly cause UTIs. SYMPTOMS  Symptoms of UTIs may vary by age and gender of the patient and by the location of the infection. Symptoms in young women typically include a frequent and intense urge to urinate and a painful, burning feeling in the bladder or urethra during urination. Older women and men are more likely to be tired, shaky, and weak and have muscle aches and abdominal pain. A fever may mean the infection is in your kidneys. Other symptoms of a kidney infection include pain in your back or sides below the ribs, nausea, and vomiting. DIAGNOSIS To diagnose a UTI, your caregiver will ask you about your symptoms. Your caregiver also will ask to provide a urine sample. The urine sample will be tested for bacteria and white blood cells. White blood cells are made by your body to help fight infection. TREATMENT  Typically, UTIs can be treated with medication. Because most UTIs are caused by a bacterial infection, they usually can be treated with the use of antibiotics. The choice of antibiotic and length of treatment depend on your symptoms and the  type of bacteria causing your infection. HOME CARE INSTRUCTIONS  If you were prescribed antibiotics, take them exactly as your caregiver instructs you. Finish the medication even if you feel better after you have only taken some of the medication.  Drink enough water and fluids to keep your urine clear or pale yellow.  Avoid caffeine, tea, and carbonated beverages. They tend to irritate your bladder.  Empty your bladder often. Avoid holding urine for long periods of time.  Empty your bladder before and after sexual intercourse.  After a bowel movement, women should cleanse from front to back. Use each tissue only once. SEEK MEDICAL CARE IF:   You have back pain.  You develop a fever.  Your symptoms do not begin to resolve within 3 days. SEEK IMMEDIATE MEDICAL CARE IF:   You have severe back pain or lower abdominal pain.  You develop chills.  You have nausea or vomiting.  You have continued burning or discomfort with urination. MAKE SURE YOU:   Understand these instructions.  Will watch your condition.  Will get help right away if you are not doing well or get worse. Document Released: 09/21/2005 Document Revised: 06/12/2012 Document Reviewed: 01/20/2012 [facility] CenterExitCare Patient Information 2014 [employer]Care, [place].

## 2014-02-16 NOTE — ED Provider Notes (Signed)
Suzanne FryShannon T Ramos is a 38 y.o. female who presents to Urgent Care today for sore throat and dysuria. This Is Been Present for about One Week. Patient Additionally Notes chapped Lips She denies any cough congestion shortness of breath nausea vomiting or diarrhea. She has been using Carmex which has not helped much. She denies any fevers or chills. She notes minor dysuria describes burning with urination. She tried fluconazole which did not help. She denies any significant urgency frequency or abdominal pain. She denies any vaginal discharge.   Past Medical History  Diagnosis Date  . No significant past medical history    History  Substance Use Topics  . Smoking status: Never Smoker   . Smokeless tobacco: Not on file  . Alcohol Use: No   ROS as above Medications: No current facility-administered medications for this encounter.   Current Outpatient Prescriptions  Medication Sig Dispense Refill  . cephALEXin (KEFLEX) 500 MG capsule Take 1 capsule (500 mg total) by mouth 2 (two) times daily.  14 capsule  0  . fluconazole (DIFLUCAN) 150 MG tablet Take 1 tablet (150 mg total) by mouth once.  1 tablet  1    Exam:  BP 136/98  Pulse 91  Temp(Src) 98.5 F (36.9 C) (Oral)  Resp 18  SpO2 100% Gen: Well NAD HEENT: EOMI,  MMM mildly chapped lips. Posterior pharynx mildly erythematous. Tympanic membranes are normal appearing bilaterally Lungs: Normal work of breathing. CTABL Heart: RRR no MRG Abd: NABS, Soft. NT, ND no CV angle tenderness to percussion Exts: Brisk capillary refill, warm and well perfused.   Results for orders placed during the hospital encounter of 02/16/14 (from the past 24 hour(s))  POCT URINALYSIS DIP (DEVICE)     Status: Abnormal   Collection Time    02/16/14  1:37 PM      Result Value Ref Range   Glucose, UA NEGATIVE  NEGATIVE mg/dL   Bilirubin Urine NEGATIVE  NEGATIVE   Ketones, ur NEGATIVE  NEGATIVE mg/dL   Specific Gravity, Urine >=1.030  1.005 - 1.030   Hgb urine  dipstick SMALL (*) NEGATIVE   pH 6.5  5.0 - 8.0   Protein, ur 30 (*) NEGATIVE mg/dL   Urobilinogen, UA 0.2  0.0 - 1.0 mg/dL   Nitrite NEGATIVE  NEGATIVE   Leukocytes, UA NEGATIVE  NEGATIVE  POCT PREGNANCY, URINE     Status: None   Collection Time    02/16/14  1:39 PM      Result Value Ref Range   Preg Test, Ur NEGATIVE  NEGATIVE   No results found.  Assessment and Plan: 38 y.o. female with UTI. Urine culture pending. Plan to treat empirically with Keflex. Additionally we'll have fluconazole in case patient develops a yeast infection. Additionally if she does have strep throat Keflex will work for that as well. Recommend Tylenol or ibuprofen for her URI type symptoms. Followup with primary care provider. Recommend Vaseline on her chapped lips.   Discussed warning signs or symptoms. Please see discharge instructions. Patient expresses understanding.    Rodolph BongEvan S Cayman Brogden, MD 02/16/14 [id number]

## 2014-02-16 NOTE — ED Notes (Signed)
C/o sore throat, dysuria, and hematuria x 1 wk.  No relief with otc meds.  Denies fever, n/v/d

## 2014-02-17 LAB — URINE CULTURE
COLONY COUNT: NO GROWTH
Culture: NO GROWTH
Special Requests: NORMAL

## 2015-01-13 ENCOUNTER — Emergency Department (HOSPITAL_BASED_OUTPATIENT_CLINIC_OR_DEPARTMENT_OTHER)
Admission: EM | Admit: 2015-01-13 | Discharge: 2015-01-13 | Disposition: A | Discharge status: Home / Self Care | Payer: Self-pay | Attending: Emergency Medicine | Admitting: Emergency Medicine

## 2015-01-13 ENCOUNTER — Encounter (HOSPITAL_BASED_OUTPATIENT_CLINIC_OR_DEPARTMENT_OTHER): Payer: Self-pay | Admitting: Emergency Medicine

## 2015-01-13 DIAGNOSIS — Z792 Long term (current) use of antibiotics: Secondary | ICD-10-CM | POA: Insufficient documentation

## 2015-01-13 DIAGNOSIS — J0100 Acute maxillary sinusitis, unspecified: Secondary | ICD-10-CM | POA: Insufficient documentation

## 2015-01-13 MED ORDER — TRIAMCINOLONE ACETONIDE 55 MCG/ACT NA AERO: 2.0000 | INHALATION_SPRAY | Freq: Every day | NASAL | Status: DC

## 2015-01-13 MED ORDER — AMOXICILLIN 500 MG PO CAPS: 500.0000 mg | ORAL_CAPSULE | Freq: Three times a day (TID) | ORAL | Status: DC

## 2015-01-13 NOTE — Discharge Instructions (Signed)
Sinusitis °Sinusitis is redness, soreness, and puffiness (inflammation) of the air pockets in the bones of your face (sinuses). The redness, soreness, and puffiness can cause air and mucus to get trapped in your sinuses. This can allow germs to grow and cause an infection.  °HOME CARE  °· Drink enough fluids to keep your pee (urine) clear or pale yellow. °· Use a humidifier in your home. °· Run a hot shower to create steam in the bathroom. Sit in the bathroom with the door closed. Breathe in the steam 3-4 times a day. °· Put a warm, moist washcloth on your face 3-4 times a day, or as told by your doctor. °· Use salt water sprays (saline sprays) to wet the thick fluid in your nose. This can help the sinuses drain. °· Only take medicine as told by your doctor. °GET HELP RIGHT AWAY IF:  °· Your pain gets worse. °· You have very bad headaches. °· You are sick to your stomach (nauseous). °· You throw up (vomit). °· You are very sleepy (drowsy) all the time. °· Your face is puffy (swollen). °· Your vision changes. °· You have a stiff neck. °· You have trouble breathing. °MAKE SURE YOU:  °· Understand these instructions. °· Will watch your condition. °· Will get help right away if you are not doing well or get worse. °Document Released: 05/30/2008 Document Revised: 09/05/2012 Document Reviewed: 07/17/2012 °ExitCare® Patient Information ©2015 [employer]. This information is not intended to replace advice given to you by your health care provider. Make sure you discuss any questions you have with your health care provider. ° °

## 2015-01-13 NOTE — ED Provider Notes (Signed)
CSN: [id number]     Arrival date & time 01/13/15  1853 History   First MD Initiated Contact with Patient 01/13/15 1906     Chief Complaint  Patient presents with  . Otalgia     (Consider location/radiation/quality/duration/timing/severity/associated sxs/prior Treatment) HPI Comments: Pt come in with c/o left sided facial pain that has been going on for 2 weeks after a cold. Pt state that she is now having left ear pain and facial pain. No fever. Has been taking ibuprofen with mild relief.  The history is provided by the patient.    Past Medical History  Diagnosis Date  . No significant past medical history    Past Surgical History  Procedure Laterality Date  . Cesarean section     No family history on file. History  Substance Use Topics  . Smoking status: Never Smoker   . Smokeless tobacco: Not on file  . Alcohol Use: Yes   OB History    No data available     Review of Systems  All other systems reviewed and are negative.     Allergies  Review of patient's allergies indicates no known allergies.  Home Medications   Prior to Admission medications   Medication Sig Start Date End Date Taking? Authorizing Provider  amoxicillin (AMOXIL) 500 MG capsule Take 1 capsule (500 mg total) by mouth 3 (three) times daily. 01/13/15   Teressa LowerVrinda Filemon Breton, NP  cephALEXin (KEFLEX) 500 MG capsule Take 1 capsule (500 mg total) by mouth 2 (two) times daily. 02/16/14   Rodolph BongEvan S Corey, MD  fluconazole (DIFLUCAN) 150 MG tablet Take 1 tablet (150 mg total) by mouth once. 02/16/14   Rodolph BongEvan S Corey, MD  triamcinolone (NASACORT AQ) 55 MCG/ACT AERO nasal inhaler Place 2 sprays into the nose daily. 01/13/15   Teressa LowerVrinda Zoha Spranger, NP   BP 156/96 mmHg  Pulse 89  Temp(Src) 98.4 F (36.9 C) (Oral)  Resp 18  Ht 5\' 5"  (1.651 m)  Wt 180 lb (81.647 kg)  BMI 29.95 kg/m2  SpO2 100% Physical Exam  Constitutional: She is oriented to person, place, and time. She appears well-developed and well-nourished.  HENT:   Right Ear: External ear normal.  Left Ear: External ear normal.  Nose: Left sinus exhibits maxillary sinus tenderness.  Mouth/Throat: Posterior oropharyngeal erythema present.  Eyes: Conjunctivae are normal. Pupils are equal, round, and reactive to light.  Neck: Normal range of motion. Neck supple.  Cardiovascular: Normal rate and regular rhythm.   Pulmonary/Chest: Effort normal and breath sounds normal.  Musculoskeletal: Normal range of motion.  Neurological: She is oriented to person, place, and time.  Skin: Skin is dry.  Psychiatric: She has a normal mood and affect.  Nursing note and vitals reviewed.   ED Course  Procedures (including critical care time) Labs Review Labs Reviewed - No data to display  Imaging Review No results found.   EKG Interpretation None      MDM   Final diagnoses:  Acute maxillary sinusitis, recurrence not specified    Will treat for sinusitis with amoxicillin and nasacort    Teressa LowerVrinda Eden Rho, NP 01/13/15 1924  Rolland PorterMark James, MD 01/17/15 [phone]

## 2015-01-13 NOTE — ED Notes (Signed)
C/o left ear and facial pain X3 days, no F/V/D, A/O X4, ambulatory and in NAD

## 2015-03-01 ENCOUNTER — Encounter (HOSPITAL_BASED_OUTPATIENT_CLINIC_OR_DEPARTMENT_OTHER): Payer: Self-pay | Admitting: Emergency Medicine

## 2015-03-01 ENCOUNTER — Emergency Department (HOSPITAL_BASED_OUTPATIENT_CLINIC_OR_DEPARTMENT_OTHER)
Admission: EM | Admit: 2015-03-01 | Discharge: 2015-03-01 | Disposition: A | Discharge status: Home / Self Care | Payer: Self-pay | Attending: Emergency Medicine | Admitting: Emergency Medicine

## 2015-03-01 DIAGNOSIS — M7918 Myalgia, other site: Secondary | ICD-10-CM

## 2015-03-01 DIAGNOSIS — M25512 Pain in left shoulder: Secondary | ICD-10-CM | POA: Insufficient documentation

## 2015-03-01 DIAGNOSIS — Z7952 Long term (current) use of systemic steroids: Secondary | ICD-10-CM | POA: Insufficient documentation

## 2015-03-01 DIAGNOSIS — Z792 Long term (current) use of antibiotics: Secondary | ICD-10-CM | POA: Insufficient documentation

## 2015-03-01 DIAGNOSIS — M542 Cervicalgia: Secondary | ICD-10-CM | POA: Insufficient documentation

## 2015-03-01 MED ORDER — NAPROXEN 500 MG PO TABS: 500.0000 mg | ORAL_TABLET | Freq: Two times a day (BID) | ORAL | Status: DC

## 2015-03-01 MED ORDER — HYDROCODONE-ACETAMINOPHEN 5-325 MG PO TABS: 1.0000 | ORAL_TABLET | ORAL | Status: DC | PRN

## 2015-03-01 MED ORDER — NAPROXEN 250 MG PO TABS
500.0000 mg | ORAL_TABLET | Freq: Once | ORAL | Status: AC
  Administered 2015-03-01: 500 mg via ORAL
  Filled 2015-03-01: qty 2

## 2015-03-01 NOTE — ED Provider Notes (Signed)
CSN: [id number]     Arrival date & time 03/01/15  1927 History   First MD Initiated Contact with Patient 03/01/15 2231     Chief Complaint  Patient presents with  . Generalized Body Aches    (Consider location/radiation/quality/duration/timing/severity/associated sxs/prior Treatment) HPI  Suzanne Ramos is a 39 yo female with report persistent pain in her left side of her neck and left shoulder.  This pain has been ongoing x several months. She describes the pain as aching and rates as 6/10.  She denies fevers, chills, chest pain, shortness of breath, focal weakness,   Past Medical History  Diagnosis Date  . No significant past medical history    Past Surgical History  Procedure Laterality Date  . Cesarean section     History reviewed. No pertinent family history. History  Substance Use Topics  . Smoking status: Never Smoker   . Smokeless tobacco: Not on file  . Alcohol Use: Yes   OB History    No data available     Review of Systems  Constitutional: Negative for fever, chills and fatigue.  Musculoskeletal: Positive for myalgias and neck pain. Negative for back pain and neck stiffness.  Skin: Negative for rash.      Allergies  Review of patient's allergies indicates no known allergies.  Home Medications   Prior to Admission medications   Medication Sig Start Date End Date Taking? Authorizing Provider  amoxicillin (AMOXIL) 500 MG capsule Take 1 capsule (500 mg total) by mouth 3 (three) times daily. 01/13/15   Teressa LowerVrinda Pickering, NP  cephALEXin (KEFLEX) 500 MG capsule Take 1 capsule (500 mg total) by mouth 2 (two) times daily. 02/16/14   Rodolph BongEvan S Corey, MD  fluconazole (DIFLUCAN) 150 MG tablet Take 1 tablet (150 mg total) by mouth once. 02/16/14   Rodolph BongEvan S Corey, MD  triamcinolone (NASACORT AQ) 55 MCG/ACT AERO nasal inhaler Place 2 sprays into the nose daily. 01/13/15   Teressa LowerVrinda Pickering, NP   BP 159/99 mmHg  Pulse 98  Temp(Src) 98.7 F (37.1 C) (Oral)  Resp 16  SpO2  100% Physical Exam  Constitutional: She appears well-developed and well-nourished. No distress.  HENT:  Head: Normocephalic and atraumatic.  Eyes: Conjunctivae are normal. Right eye exhibits no discharge. Left eye exhibits no discharge. No scleral icterus.  Cardiovascular: Intact distal pulses.   Pulmonary/Chest: Effort normal.  Musculoskeletal: She exhibits tenderness. She exhibits no edema.  TTP left sternocleidomastoid, left trapezius and left deltoid. No decreased ROM, redness or swelling noted.   Neurological: She is alert. No sensory deficit.  Skin: Skin is warm and dry. She is not diaphoretic.  Nursing note and vitals reviewed.   ED Course  Procedures (including critical care time) Labs Review Labs Reviewed - No data to display  Imaging Review No results found.   EKG Interpretation None      MDM   Final diagnoses:  Musculoskeletal pain   39 yo ongoing musculoskeletal pain, no new injuries.  Discussed treatment with short course of pain meds and NSAIDs. Resources provided to establish care with primary care provider for further evaluation of ongoing pain.  No indication of neuro deficits or chest pain or shortness of breath. Pt is well-appearing, in no acute distress and vital signs are stable.  They appear safe to be discharged.   Return precautions provided. Pt aware of plan and in agreement.   Filed Vitals:   03/01/15 1934 03/01/15 2330 03/01/15 2331  BP: 159/99 141/98   Pulse: 98  89  Temp: 98.7 F (37.1 C)    TempSrc: Oral    Resp: 16    SpO2: 100%  99%   Meds given in ED:  Medications  naproxen (NAPROSYN) tablet 500 mg (500 mg Oral Given 03/01/15 2330)    Discharge Medication List as of 03/01/2015 11:46 PM    START taking these medications   Details  HYDROcodone-acetaminophen (NORCO/VICODIN) 5-325 MG per tablet Take 1 tablet by mouth every 4 (four) hours as needed., Starting 03/01/2015, Until Discontinued, Print    naproxen (NAPROSYN) 500 MG tablet Take  1 tablet (500 mg total) by mouth 2 (two) times daily., Starting 03/01/2015, Until Discontinued, Print           Harle Battiest, NP 03/03/15 2053  Cy Blamer, MD 03/04/15 2329

## 2015-03-01 NOTE — ED Notes (Signed)
Pt reports Left side of her body is having pain over several months, denies neuro deficits and none noted

## 2015-03-01 NOTE — Discharge Instructions (Signed)
Please follow directions provided. Use the resource guide provided to find a primary care doctor to follow-up with your symptoms. Take the naproxen twice a day to help with pain and inflammation. You may use the Vicodin for pain not relieved by the naproxen. Don't hesitate to return for any new, worsening, or concerning symptoms.   SEEK IMMEDIATE MEDICAL CARE IF:  You have pain that is getting worse and is not relieved by medications.  You develop chest pain that is associated with shortness or breath, sweating, feeling sick to your stomach (nauseous), or throw up (vomit).  Your pain becomes localized to the abdomen.  You develop any new symptoms that seem different or that concern you.   Emergency Department Resource Guide 1) Find a Doctor and Pay Out of Pocket Although you won't have to find out who is covered by your insurance plan, it is a good idea to ask around and get recommendations. You will then need to call the office and see if the doctor you have chosen will accept you as a new patient and what types of options they offer for patients who are self-pay. Some doctors offer discounts or will set up payment plans for their patients who do not have insurance, but you will need to ask so you aren't surprised when you get to your appointment.  2) Contact Your Local Health Department Not all health departments have doctors that can see patients for sick visits, but many do, so it is worth a call to see if yours does. If you don't know where your local health department is, you can check in your phone book. The CDC also has a tool to help you locate your state's health department, and many state websites also have listings of all of their local health departments.  3) Find a Walk-in Clinic If your illness is not likely to be very severe or complicated, you may want to try a walk in clinic. These are popping up all over the country in pharmacies, drugstores, and shopping centers. They're usually  staffed by nurse practitioners or physician assistants that have been trained to treat common illnesses and complaints. They're usually fairly quick and inexpensive. However, if you have serious medical issues or chronic medical problems, these are probably not your best option.  No Primary Care Doctor: - Call Health Connect at  [phone] - they can help you locate a primary care doctor that  accepts your insurance, provides certain services, etc. - Physician Referral Service- [phone]  Chronic Pain Problems: Organization         Address  Phone   Notes  [facility]  [phone] Patients need to be referred by their primary care doctor.   Medication Assistance: Organization         Address  Phone   Notes  [facility] Medication [facility] [address] [place] [place], [place] [address] [phone] --Must be a resident of [facility] -- Must have NO insurance coverage whatsoever (no Medicaid/ Medicare, etc.) -- The pt. MUST have a primary care doctor that directs their care regularly and follows them in the community   [facility]  [phone]   [employer]  [phone]    Agencies that provide inexpensive medical care: Organization         Address  Phone   Notes  [facility]  [phone]   [facility]    [phone]   Women's  [facility] [address] [place], [place] [address] [phone]   [facility] [place] [address] [place] [place] [phone]   Planned Parenthood    [phone]   [facility]    [phone]   Community Health and [facility]  [address], [place] Phone:  [phone], Fax:  [phone] Hours of Operation:  9 am - 6 pm, M-F.  Also accepts Medicaid/Medicare and self-pay.  [facility]  [address], [place] Phone: [phone], Fax: [phone]. Hours of  Operation:  8:30 am - 5:30 pm, M-F.  Also accepts Medicaid and self-pay.  [facility] [place] [address] [place] Phone: [phone]   [facility] [address] Natasha Bence [place], [place] [phone], Ext. [phone] Mondays & Thursdays: 7-9 AM.  First 15 patients are seen on a first come, first serve basis.    Medicaid-accepting [facility] Providers:  Organization         Address  Phone   Notes  [facility] [address], [place] [phone] Also accepts self-pay patients.  [facility] [address] Laurell Josephs [place], [place]  [phone]   [facility] [address], [place] [phone]   [facility] [address] [place] [phone]   Renaye Rakers [address], [place]   [phone] Only accepts [place] patients after they have their name applied to their card.   Self-Pay (no insurance) in [facility]:  Organization         Address  Phone   Notes  Sickle Cell Patients, [facility] [address] [place], [place] [phone]   [facility] [address] [place], [place] [phone]   [facility] [place]  [address], [place] [phone]   [facility]/Dr. Osei-Bonsu  [address] [place] or [address], [place] [phone] Phone number for both [place] and [place] locations is the same.  [facility]gent [facility] and [facility] [address] [place] [phone]   [facility] [address], [place] or [address] [phone]   [facility] [address], [place] [phone], phone; [phone], fax Sees patients 1st and 3rd Saturday of every month.  Must not qualify for public or private insurance (i.e. Medicaid, Medicare,  [place] Health Choice, Veterans' Benefits)  Household income should be no more than 200% of the poverty level The clinic cannot treat you if you are pregnant or think you are pregnant  Sexually transmitted diseases are not treated at the clinic.    Dental Care: Organization         Address  Phone  Notes  [facility] Department of [facility] [address] [place], [place] [phone] Accepts children up to age 69 who are enrolled in [place] or [place] Health Choice; pregnant women with a Medicaid card; and children who have applied for Medicaid or [place] Health Choice, but were declined, whose parents can pay a reduced fee at time of service.  [facility] Department of [facility]  [address], [place] [phone] Accepts children up to age 62 who are  enrolled in Medicaid or [place] Health Choice; pregnant women with a Medicaid card; and children who have applied for Medicaid or [place] Health Choice, but were declined, whose parents can pay a reduced fee at time of service.  [facility]  [address], [place] [phone] Patients are seen by appointment only. Walk-ins are not accepted. [facility] will see patients 38 years of age and older. Monday - Tuesday (8am-5pm) Most Wednesdays (8:30-5pm) $30 per visit, cash only  [facility]  [address], [place] [phone] Patients are seen by appointment only. Walk-ins are not accepted. [facility] will see patients 38 years of age and older. One Wednesday Evening (Monthly: Volunteer Based).  $30 per visit, cash only  [employer] [facility]  [phone]) [phone] for adults; Children under age 864, call Graduate Pediatric Dentistry at [phone]. Children aged 564-14, please call [phone]) [phone] to request a pediatric application.  Dental services are provided in all areas of dental care including fillings, crowns and bridges,  complete and partial dentures, implants, gum treatment, root canals, and extractions. Preventive care is also provided. Treatment is provided to both adults and children. Patients are selected via a lottery and there is often a waiting list.   [facility] [address], [place]  [phone] [email or web address]   [facility] [address], [place], [place] [phone], Ext. [phone] Second and Fourth Thursday of each month, opens at 6:30 AM; Clinic ends at 9 AM.  Patients are seen on a first-come first-served basis, and a limited number are seen during each clinic.   [facility]  [address], [place], [place] [phone]   Eligibility Requirements You must have lived in [place], [place], or [place] counties for at least the last three months.   You cannot be eligible for state or federal sponsored National Cityhealthcare insurance, including CIGNAVeterans Administration, [place]icaid, or [employer]icare.   You generally cannot be eligible for healthcare insurance through your employer.    How to apply: Eligibility screenings are held every Tuesday and Wednesday afternoon from 1:00 pm until 4:00 pm. You do not need an appointment for the interview!  [facility] [address], [place], [place] [phone]   [facility]  [phone]   [facility]  [phone]   [facility]  [phone]    Behavioral Health Resources in the Community: Intensive Outpatient Programs Organization         Address  Phone  Notes  [facility] [address], [place], [place] [phone]   [facility] [address], [place], [place] [phone]   [facility] [address], [place], [place]  [phone]   [facility] [address],  [place], [place] [phone] or [phone]   Substance Abuse Resources Organization          Address  Phone  Notes  [facility]  [phone]   [facility]  [phone]   [facility]  [phone]   [facility]  [phone]   Residential & Outpatient Substance Abuse Program  [phone]   Psychological Services Organization         Address  Phone  Notes  [facility]  [phone]- [phone]   [facility]  [phone]   [facility] [address], [place]  [phone] or [phone]    Mobile Crisis Teams Organization         Address  Phone  Notes  [facility], Mobile Crisis Care Unit  [phone]   [facility]  [address] [place], [place] [phone]   [facility] [address] [place] [phone]    Self-Help/Support Groups Organization         Address  Phone             Notes  [facility] [place] - variety of support groups  336- [phone] Call for more information  Narcotics Anonymous (NA), [facility] [address], [place]  2 meetings at this location   Statisticianesidential Treatment Programs Organization         Address  Phone  Notes  [facility] [address],    [place]  [phone]   [facility]  [address], [place] [phone], [place], [place] [phone]   [facility] [address], [place] [phone] Admissions: 8am-3pm M-F  [facility] [address]    [place], [place] [phone]   [facility] [address], [place], [place] [phone]   [facility] [address]  [place], [place] [phone]   [facility] - Intensive Outpatient [address], [place], [place] [phone]   [facility] ([facility] [address]  [place], [place] [phone] or [phone]   [facility] ([facility]) [address] [place], [place]  [phone] Accepts Medicaid  [facility] [address]  [place] [phone] Substance Abuse/Addiction Treatment   [facility] Resources Organization         Address  Phone  Notes  [facility]  [phone]   Angie FavaJulie Brannon, PhD [address], [address] [place], [place]   [phone] or [phone]   [facility]   [address] [place], [place] [phone]) [phone]   [facility] [address], [place], [place] [phone]) [phone] Insurance/Medicaid/sponsorship through [facility]  [facility] [address]                                    [place], [place] [phone] Therapy/tele-psych/case  [facility] [address].   [place], [place] [phone]    Dr. Lolly MustacheArfeen  [phone]) [phone]   [facility]  [employer] [facility] 1) [address], [place] 2) [address], [place] 3)  [address], [place] [phone]) [phone]  [phone]   [facility] Child Abuse Hotline [phone]) [phone] or [phone]) [phone] (After Hours)

## 2015-06-11 ENCOUNTER — Encounter (HOSPITAL_COMMUNITY): Payer: Self-pay | Admitting: Emergency Medicine

## 2015-06-11 ENCOUNTER — Emergency Department (HOSPITAL_COMMUNITY)
Admission: EM | Admit: 2015-06-11 | Discharge: 2015-06-11 | Disposition: A | Discharge status: Home / Self Care | Payer: Self-pay | Attending: Emergency Medicine | Admitting: Emergency Medicine

## 2015-06-11 DIAGNOSIS — H578 Other specified disorders of eye and adnexa: Secondary | ICD-10-CM | POA: Insufficient documentation

## 2015-06-11 DIAGNOSIS — R0981 Nasal congestion: Secondary | ICD-10-CM | POA: Insufficient documentation

## 2015-06-11 DIAGNOSIS — G4489 Other headache syndrome: Secondary | ICD-10-CM | POA: Insufficient documentation

## 2015-06-11 LAB — CBC WITH DIFFERENTIAL/PLATELET
BASOS ABS: 0 10*3/uL (ref 0.0–0.1)
BASOS PCT: 0 % (ref 0–1)
EOS ABS: 0.2 10*3/uL (ref 0.0–0.7)
Eosinophils Relative: 2 % (ref 0–5)
HCT: 36.1 % (ref 36.0–46.0)
HEMOGLOBIN: 12.7 g/dL (ref 12.0–15.0)
Lymphocytes Relative: 15 % (ref 12–46)
Lymphs Abs: 1.5 10*3/uL (ref 0.7–4.0)
MCH: 27.3 pg (ref 26.0–34.0)
MCHC: 35.2 g/dL (ref 30.0–36.0)
MCV: 77.6 fL — ABNORMAL LOW (ref 78.0–100.0)
MONOS PCT: 5 % (ref 3–12)
Monocytes Absolute: 0.5 10*3/uL (ref 0.1–1.0)
NEUTROS PCT: 78 % — AB (ref 43–77)
Neutro Abs: 7.8 10*3/uL — ABNORMAL HIGH (ref 1.7–7.7)
Platelets: 259 10*3/uL (ref 150–400)
RBC: 4.65 MIL/uL (ref 3.87–5.11)
RDW: 14.3 % (ref 11.5–15.5)
WBC: 9.9 10*3/uL (ref 4.0–10.5)

## 2015-06-11 LAB — COMPREHENSIVE METABOLIC PANEL
ALK PHOS: 56 U/L (ref 38–126)
ALT: 18 U/L (ref 14–54)
ANION GAP: 8 (ref 5–15)
AST: 24 U/L (ref 15–41)
Albumin: 3.9 g/dL (ref 3.5–5.0)
BILIRUBIN TOTAL: 0.6 mg/dL (ref 0.3–1.2)
BUN: 5 mg/dL — AB (ref 6–20)
CALCIUM: 8.9 mg/dL (ref 8.9–10.3)
CHLORIDE: 103 mmol/L (ref 101–111)
CO2: 25 mmol/L (ref 22–32)
CREATININE: 0.66 mg/dL (ref 0.44–1.00)
GFR calc Af Amer: >60 mL/min (ref >60)
Glucose, Bld: 82 mg/dL (ref 65–99)
Potassium: 3.3 mmol/L — ABNORMAL LOW (ref 3.5–5.1)
Sodium: 136 mmol/L (ref 135–145)
TOTAL PROTEIN: 7.6 g/dL (ref 6.5–8.1)

## 2015-06-11 MED ORDER — CETIRIZINE HCL 10 MG PO TABS: 10.0000 mg | ORAL_TABLET | Freq: Every day | ORAL | Status: DC

## 2015-06-11 MED ORDER — ACETAMINOPHEN 500 MG PO TABS
1000.0000 mg | ORAL_TABLET | Freq: Once | ORAL | Status: AC
  Administered 2015-06-11: 1000 mg via ORAL
  Filled 2015-06-11: qty 2

## 2015-06-11 NOTE — ED Provider Notes (Signed)
CSN: [id number]     Arrival date & time 06/11/15  2028 History   First MD Initiated Contact with Patient 06/11/15 2126     Chief Complaint  Patient presents with  . Headache    The patient said she is feeling like her face, her fingers, are "tight".  She is also complainig of a headache.  She says she started feeling like this at 0500 bur she went to work thinking it was going to stop.     (Consider location/radiation/quality/duration/timing/severity/associated sxs/prior Treatment) Patient is a 39 y.o. female presenting with headaches.  Headache Location: migrating. Quality:  Sharp Radiates to:  Does not radiate Severity currently:  3/10 Severity at highest:  10/10 Onset quality:  Gradual Duration:  2 weeks Timing:  Intermittent Progression:  Waxing and waning Chronicity:  Recurrent Similar to prior headaches: yes   Context: not activity, not coughing, not defecating, not eating, not stress, not exposure to cold air, not loud noise and not straining   Relieved by: self resolved. Worsened by:  Nothing Ineffective treatments:  NSAIDs Associated symptoms: congestion, facial pain and sinus pressure   Associated symptoms: no abdominal pain, no back pain, no blurred vision, no cough, no diarrhea, no dizziness, no ear pain, no fatigue, no fever, no focal weakness, no myalgias, no nausea, no neck stiffness, no photophobia, no sore throat, no vomiting and no weakness     Past Medical History  Diagnosis Date  . No significant past medical history    Past Surgical History  Procedure Laterality Date  . Cesarean section     History reviewed. No pertinent family history. History  Substance Use Topics  . Smoking status: Never Smoker   . Smokeless tobacco: Not on file  . Alcohol Use: Yes   OB History    No data available     Review of Systems  Constitutional: Negative for fever, chills, appetite change and fatigue.  HENT: Positive for congestion and sinus pressure. Negative for  ear pain, facial swelling, mouth sores and sore throat.   Eyes: Positive for redness and itching. Negative for blurred vision, photophobia and visual disturbance.  Respiratory: Negative for cough, chest tightness and shortness of breath.   Cardiovascular: Negative for chest pain and palpitations.  Gastrointestinal: Negative for nausea, vomiting, abdominal pain, diarrhea and blood in stool.  Endocrine: Negative for cold intolerance and heat intolerance.  Genitourinary: Negative for frequency, decreased urine volume and difficulty urinating.  Musculoskeletal: Negative for myalgias, back pain and neck stiffness.  Skin: Negative for rash.  Neurological: Positive for headaches. Negative for dizziness, focal weakness, weakness and light-headedness.  All other systems reviewed and are negative.     Allergies  Review of patient's allergies indicates no known allergies.  Home Medications   Prior to Admission medications   Medication Sig Start Date End Date Taking? Authorizing Provider  cetirizine (ZYRTEC) 10 MG tablet Take 1 tablet (10 mg total) by mouth daily. 06/11/15   Drema Pry, MD   BP 141/99 mmHg  Pulse 93  Temp(Src) 98.8 F (37.1 C) (Oral)  Resp 16  SpO2 100% Physical Exam  Constitutional: She is oriented to person, place, and time. She appears well-developed and well-nourished. No distress.  HENT:  Head: Normocephalic and atraumatic.  Right Ear: External ear normal.  Left Ear: External ear normal.  Nose: Mucosal edema present.  Eyes: EOM are normal. Pupils are equal, round, and reactive to light. Right eye exhibits no discharge. Left eye exhibits no discharge. Right conjunctiva is injected.  Left conjunctiva is injected. No scleral icterus.  Neck: Normal range of motion. Neck supple.  Cardiovascular: Normal rate, regular rhythm and normal heart sounds.  Exam reveals no gallop and no friction rub.   No murmur heard. Pulmonary/Chest: Effort normal and breath sounds normal. No  stridor. No respiratory distress. She has no wheezes.  Abdominal: Soft. She exhibits no distension. There is no tenderness.  Musculoskeletal: She exhibits no edema or tenderness.  Neurological: She is alert and oriented to person, place, and time. She has normal strength. No cranial nerve deficit or sensory deficit. Coordination normal. GCS eye subscore is 4. GCS verbal subscore is 5. GCS motor subscore is 6.  Reflex Scores:      Bicep reflexes are 1+ on the right side and 1+ on the left side.      Brachioradialis reflexes are 1+ on the left side.      Patellar reflexes are 1+ on the right side and 1+ on the left side. Skin: Skin is warm and dry. No rash noted. She is not diaphoretic. No erythema.  Psychiatric: She has a normal mood and affect.    ED Course  Procedures (including critical care time) Labs Review Labs Reviewed  CBC WITH DIFFERENTIAL/PLATELET - Abnormal; Notable for the following:    MCV 77.6 (*)    Neutrophils Relative % 78 (*)    Neutro Abs 7.8 (*)    All other components within normal limits  COMPREHENSIVE METABOLIC PANEL - Abnormal; Notable for the following:    Potassium 3.3 (*)    BUN 5 (*)    All other components within normal limits    Imaging Review No results found.   EKG Interpretation None      MDM   39 year old female who presents with 2 weeks of intermittent migrating sharp headache with constant dull frontal headache, with associated injected eyes and congestion. No reported fevers, coughing, URI symptoms, neck pain, visual disturbances, photo or phonophobia, focal weaknesses. Rest of the history and exam as above. Presentation was consistent with sinus headache secondary to allergies. Patient given Tylenol with improved symptoms. No evidence to suggest meningitis, subarachnoid hemorrhage, ICH, IIH, temporal arteritis, glaucoma.  Patient in good condition and is safe for discharge with trict return precautions. She is provided prescription for  Zyrtec.  Patient to follow-up with PCP as needed.   Patient seen in conjunction with Dr. Littie Deeds.  Final diagnoses:  Other headache syndrome        Drema Pry, MD 06/12/15 4098  Mirian Mo, MD 06/14/15 [phone]

## 2015-06-11 NOTE — Discharge Instructions (Signed)
General Headache Without Cause  A general headache is pain or discomfort felt around the head or neck area. The cause may not be found.   HOME CARE   · Keep all doctor visits.  · Only take medicines as told by your doctor.  · Lie down in a dark, quiet room when you have a headache.  · Keep a journal to find out if certain things bring on headaches. For example, write down:  ¨ What you eat and drink.  ¨ How much sleep you get.  ¨ Any change to your diet or medicines.  · Relax by getting a massage or doing other relaxing activities.  · Put ice or heat packs on the head and neck area as told by your doctor.  · Lessen stress.  · Sit up straight. Do not tighten (tense) your muscles.  · Quit smoking if you smoke.  · Lessen how much alcohol you drink.  · Lessen how much caffeine you drink, or stop drinking caffeine.  · Eat and sleep on a regular schedule.  · Get 7 to 9 hours of sleep, or as told by your doctor.  · Keep lights dim if bright lights bother you or make your headaches worse.  GET HELP RIGHT AWAY IF:   · Your headache becomes really bad.  · You have a fever.  · You have a stiff neck.  · You have trouble seeing.  · Your muscles are weak, or you lose muscle control.  · You lose your balance or have trouble walking.  · You feel like you will pass out (faint), or you pass out.  · You have really bad symptoms that are different than your first symptoms.  · You have problems with the medicines given to you by your doctor.  · Your medicines do not work.  · Your headache feels different than the other headaches.  · You feel sick to your stomach (nauseous) or throw up (vomit).  MAKE SURE YOU:   · Understand these instructions.  · Will watch your condition.  · Will get help right away if you are not doing well or get worse.  Document Released: 09/20/2008 Document Revised: 03/05/2012 Document Reviewed: 12/02/2011  [employer]® Patient Information ©2015 [employer]. This information is not intended to replace advice given to  you by your health care provider. Make sure you discuss any questions you have with your health care provider.

## 2015-06-11 NOTE — ED Notes (Signed)
The patient said she is feeling like her face, her fingers, are "tight".  She is also complainig of a headache.  She says she started feeling like this at [address]500 bur she went to work thinking it was going to stop.  The patient rates her pain 8/10.

## 2015-07-08 ENCOUNTER — Encounter (HOSPITAL_COMMUNITY): Payer: Self-pay | Admitting: Emergency Medicine

## 2015-07-08 ENCOUNTER — Emergency Department (INDEPENDENT_AMBULATORY_CARE_PROVIDER_SITE_OTHER)
Admission: EM | Admit: 2015-07-08 | Discharge: 2015-07-08 | Disposition: A | Discharge status: Home / Self Care | Payer: Self-pay | Source: Home / Self Care | Attending: Family Medicine | Admitting: Family Medicine

## 2015-07-08 DIAGNOSIS — J0191 Acute recurrent sinusitis, unspecified: Secondary | ICD-10-CM

## 2015-07-08 MED ORDER — HYDROCODONE-ACETAMINOPHEN 5-325 MG PO TABS: 2.0000 | ORAL_TABLET | ORAL | Status: DC | PRN

## 2015-07-08 MED ORDER — AMOXICILLIN-POT CLAVULANATE 875-125 MG PO TABS: 1.0000 | ORAL_TABLET | Freq: Two times a day (BID) | ORAL | Status: DC

## 2015-07-08 NOTE — ED Provider Notes (Signed)
CSN: [id number]     Arrival date & time 07/08/15  1647 History   First MD Initiated Contact with Patient 07/08/15 1720     Chief Complaint  Patient presents with  . Headache  . Otalgia   (Consider location/radiation/quality/duration/timing/severity/associated sxs/prior Treatment) Patient is a 39 y.o. female presenting with headaches and ear pain. The history is provided by the patient. No language interpreter was used.  Headache Pain location:  Frontal Quality:  Sharp Radiates to:  Does not radiate Onset quality:  Gradual Timing:  Constant Progression:  Worsening Chronicity:  New Similar to prior headaches: yes   Relieved by:  Nothing Worsened by:  Nothing Associated symptoms: ear pain   Risk factors: no anger   Otalgia Associated symptoms: headaches     Past Medical History  Diagnosis Date  . No significant past medical history    Past Surgical History  Procedure Laterality Date  . Cesarean section     No family history on file. History  Substance Use Topics  . Smoking status: Never Smoker   . Smokeless tobacco: Not on file  . Alcohol Use: Yes   OB History    No data available     Review of Systems  HENT: Positive for ear pain.   Neurological: Positive for headaches.  All other systems reviewed and are negative.   Allergies  Review of patient's allergies indicates no known allergies.  Home Medications   Prior to Admission medications   Medication Sig Start Date End Date Taking? Authorizing Provider  cetirizine (ZYRTEC) 10 MG tablet Take 1 tablet (10 mg total) by mouth daily. 06/11/15   Drema PryPedro Cardama, MD   BP 145/93 mmHg  Pulse 86  Temp(Src) 98.6 F (37 C) (Oral)  Resp 14  SpO2 97% Physical Exam  Constitutional: She is oriented to person, place, and time. She appears well-developed and well-nourished.  HENT:  Head: Normocephalic and atraumatic.  Right Ear: External ear normal.  Left Ear: External ear normal.  Mouth/Throat: Posterior  oropharyngeal erythema present.  Tender maxillary sinuses,  Eyes: Conjunctivae and EOM are normal. Pupils are equal, round, and reactive to light.  Neck: Normal range of motion. Neck supple.  Pulmonary/Chest: Effort normal.  Abdominal: Soft. She exhibits no distension.  Musculoskeletal: Normal range of motion.  Neurological: She is alert and oriented to person, place, and time.  Skin: Skin is warm and dry.  Psychiatric: She has a normal mood and affect.  Nursing note and vitals reviewed.   ED Course  Procedures (including critical care time) Labs Review Labs Reviewed - No data to display  Imaging Review No results found.   MDM  Pt has pain in left maxillary and left frontal sinuses.   Pt has pain in her left ear.  Pt has no red flags for pathology.     1. Acute recurrent sinusitis, unspecified location     augmentin Hydrocodone avs Resource guide  Elson AreasLeslie K Yaritza Leist, [place] JerseyPA-C 07/08/15 1832

## 2015-07-08 NOTE — Discharge Instructions (Signed)
Headaches, Frequently Asked Questions °MIGRAINE HEADACHES °Q: What is migraine? What causes it? How can I treat it? °A: Generally, migraine headaches begin as a dull ache. Then they develop into a constant, throbbing, and pulsating pain. You may experience pain at the temples. You may experience pain at the front or back of one or both sides of the head. The pain is usually accompanied by a combination of: °· Nausea. °· Vomiting. °· Sensitivity to light and noise. °Some people (about 15%) experience an aura (see below) before an attack. The cause of migraine is believed to be chemical reactions in the brain. Treatment for migraine may include over-the-counter or prescription medications. It may also include self-help techniques. These include relaxation training and biofeedback.  °Q: What is an aura? °A: About 15% of people with migraine get an "aura". This is a sign of neurological symptoms that occur before a migraine headache. You may see wavy or jagged lines, dots, or flashing lights. You might experience tunnel vision or blind spots in one or both eyes. The aura can include visual or auditory hallucinations (something imagined). It may include disruptions in smell (such as strange odors), taste or touch. Other symptoms include: °· Numbness. °· A "pins and needles" sensation. °· Difficulty in recalling or speaking the correct word. °These neurological events may last as long as 60 minutes. These symptoms will fade as the headache begins. °Q: What is a trigger? °A: Certain physical or environmental factors can lead to or "trigger" a migraine. These include: °· Foods. °· Hormonal changes. °· Weather. °· Stress. °It is important to remember that triggers are different for everyone. To help prevent migraine attacks, you need to figure out which triggers affect you. Keep a headache diary. This is a good way to track triggers. The diary will help you talk to your healthcare professional about your condition. °Q: Does  weather affect migraines? °A: Bright sunshine, hot, humid conditions, and drastic changes in barometric pressure may lead to, or "trigger," a migraine attack in some people. But studies have shown that weather does not act as a trigger for everyone with migraines. °Q: What is the link between migraine and hormones? °A: Hormones start and regulate many of your body's functions. Hormones keep your body in balance within a constantly changing environment. The levels of hormones in your body are unbalanced at times. Examples are during menstruation, pregnancy, or menopause. That can lead to a migraine attack. In fact, about three quarters of all women with migraine report that their attacks are related to the menstrual cycle.  °Q: Is there an increased risk of stroke for migraine sufferers? °A: The likelihood of a migraine attack causing a stroke is very remote. That is not to say that migraine sufferers cannot have a stroke associated with their migraines. In persons under age 40, the most common associated factor for stroke is migraine headache. But over the course of a person's normal life span, the occurrence of migraine headache may actually be associated with a reduced risk of dying from cerebrovascular disease due to stroke.  °Q: What are acute medications for migraine? °A: Acute medications are used to treat the pain of the headache after it has started. Examples over-the-counter medications, NSAIDs, ergots, and triptans.  °Q: What are the triptans? °A: Triptans are the newest class of abortive medications. They are specifically targeted to treat migraine. Triptans are vasoconstrictors. They moderate some chemical reactions in the brain. The triptans work on receptors in your brain. Triptans help   to restore the balance of a neurotransmitter called serotonin. Fluctuations in levels of serotonin are thought to be a main cause of migraine.  °Q: Are over-the-counter medications for migraine effective? °A:  Over-the-counter, or "OTC," medications may be effective in relieving mild to moderate pain and associated symptoms of migraine. But you should see your caregiver before beginning any treatment regimen for migraine.  °Q: What are preventive medications for migraine? °A: Preventive medications for migraine are sometimes referred to as "prophylactic" treatments. They are used to reduce the frequency, severity, and length of migraine attacks. Examples of preventive medications include antiepileptic medications, antidepressants, beta-blockers, calcium channel blockers, and NSAIDs (nonsteroidal anti-inflammatory drugs). °Q: Why are anticonvulsants used to treat migraine? °A: During the past few years, there has been an increased interest in antiepileptic drugs for the prevention of migraine. They are sometimes referred to as "anticonvulsants". Both epilepsy and migraine may be caused by similar reactions in the brain.  °Q: Why are antidepressants used to treat migraine? °A: Antidepressants are typically used to treat people with depression. They may reduce migraine frequency by regulating chemical levels, such as serotonin, in the brain.  °Q: What alternative therapies are used to treat migraine? °A: The term "alternative therapies" is often used to describe treatments considered outside the scope of conventional Western medicine. Examples of alternative therapy include acupuncture, acupressure, and yoga. Another common alternative treatment is herbal therapy. Some herbs are believed to relieve headache pain. Always discuss alternative therapies with your caregiver before proceeding. Some herbal products contain arsenic and other toxins. °TENSION HEADACHES °Q: What is a tension-type headache? What causes it? How can I treat it? °A: Tension-type headaches occur randomly. They are often the result of temporary stress, anxiety, fatigue, or anger. Symptoms include soreness in your temples, a tightening band-like sensation  around your head (a "vice-like" ache). Symptoms can also include a pulling feeling, pressure sensations, and contracting head and neck muscles. The headache begins in your forehead, temples, or the back of your head and neck. Treatment for tension-type headache may include over-the-counter or prescription medications. Treatment may also include self-help techniques such as relaxation training and biofeedback. °CLUSTER HEADACHES °Q: What is a cluster headache? What causes it? How can I treat it? °A: Cluster headache gets its name because the attacks come in groups. The pain arrives with little, if any, warning. It is usually on one side of the head. A tearing or bloodshot eye and a runny nose on the same side of the headache may also accompany the pain. Cluster headaches are believed to be caused by chemical reactions in the brain. They have been described as the most severe and intense of any headache type. Treatment for cluster headache includes prescription medication and oxygen. °SINUS HEADACHES °Q: What is a sinus headache? What causes it? How can I treat it? °A: When a cavity in the bones of the face and skull (a sinus) becomes inflamed, the inflammation will cause localized pain. This condition is usually the result of an allergic reaction, a tumor, or an infection. If your headache is caused by a sinus blockage, such as an infection, you will probably have a fever. An x-ray will confirm a sinus blockage. Your caregiver's treatment might include antibiotics for the infection, as well as antihistamines or decongestants.  °REBOUND HEADACHES °Q: What is a rebound headache? What causes it? How can I treat it? °A: A pattern of taking acute headache medications too often can lead to a condition known as "rebound headache."   A pattern of taking too much headache medication includes taking it more than 2 days per week or in excessive amounts. That means more than the label or a caregiver advises. With rebound  headaches, your medications not only stop relieving pain, they actually begin to cause headaches. Doctors treat rebound headache by tapering the medication that is being overused. Sometimes your caregiver will gradually substitute a different type of treatment or medication. Stopping may be a challenge. Regularly overusing a medication increases the potential for serious side effects. Consult a caregiver if you regularly use headache medications more than 2 days per week or more than the label advises. ADDITIONAL QUESTIONS AND ANSWERS Q: What is biofeedback? A: Biofeedback is a self-help treatment. Biofeedback uses special equipment to monitor your body's involuntary physical responses. Biofeedback monitors:  Breathing.  Pulse.  Heart rate.  Temperature.  Muscle tension.  Brain activity. Biofeedback helps you refine and perfect your relaxation exercises. You learn to control the physical responses that are related to stress. Once the technique has been mastered, you do not need the equipment any more. Q: Are headaches hereditary? A: Four out of five (80%) of people that suffer report a family history of migraine. Scientists are not sure if this is genetic or a family predisposition. Despite the uncertainty, a child has a 50% chance of having migraine if one parent suffers. The child has a 75% chance if both parents suffer.  Q: Can children get headaches? A: By the time they reach high school, most young people have experienced some type of headache. Many safe and effective approaches or medications can prevent a headache from occurring or stop it after it has begun.  Q: What type of doctor should I see to diagnose and treat my headache? A: Start with your primary caregiver. Discuss his or her experience and approach to headaches. Discuss methods of classification, diagnosis, and treatment. Your caregiver may decide to recommend you to a headache specialist, depending upon your symptoms or other  physical conditions. Having diabetes, allergies, etc., may require a more comprehensive and inclusive approach to your headache. The National Headache Foundation will provide, upon request, a list of [facility] physician members in your state. Document Released: 03/03/2004 Document Revised: 03/05/2012 Document Reviewed: 08/11/2008 [facility] Patient Information 2015 [place], [place]. This information is not intended to replace advice given to you by your health care provider. Make sure you discuss any questions you have with your health care provider.  Sinusitis Sinusitis is redness, soreness, and inflammation of the paranasal sinuses. Paranasal sinuses are air pockets within the bones of your face (beneath the eyes, the middle of the forehead, or above the eyes). In healthy paranasal sinuses, mucus is able to drain out, and air is able to circulate through them by way of your nose. However, when your paranasal sinuses are inflamed, mucus and air can become trapped. This can allow bacteria and other germs to grow and cause infection. Sinusitis can develop quickly and last only a short time (acute) or continue over a long period (chronic). Sinusitis that lasts for more than 12 weeks is considered chronic.  CAUSES  Causes of sinusitis include:  Allergies.  Structural abnormalities, such as displacement of the cartilage that separates your nostrils (deviated septum), which can decrease the air flow through your nose and sinuses and affect sinus drainage.  Functional abnormalities, such as when the small hairs (cilia) that line your sinuses and help remove mucus do not work properly or are not present. SIGNS AND  SYMPTOMS  Symptoms of acute and chronic sinusitis are the same. The primary symptoms are pain and pressure around the affected sinuses. Other symptoms include:  Upper toothache.  Earache.  Headache.  Bad breath.  Decreased sense of smell and taste.  A cough, which worsens when you are lying  flat.  Fatigue.  Fever.  Thick drainage from your nose, which often is green and may contain pus (purulent).  Swelling and warmth over the affected sinuses. DIAGNOSIS  Your health care provider will perform a physical exam. During the exam, your health care provider may:  Look in your nose for signs of abnormal growths in your nostrils (nasal polyps).  Tap over the affected sinus to check for signs of infection.  View the inside of your sinuses (endoscopy) using an imaging device that has a light attached (endoscope). If your health care provider suspects that you have chronic sinusitis, one or more of the following tests may be recommended:  Allergy tests.  Nasal culture. A sample of mucus is taken from your nose, sent to a lab, and screened for bacteria.  Nasal cytology. A sample of mucus is taken from your nose and examined by your health care provider to determine if your sinusitis is related to an allergy. TREATMENT  Most cases of acute sinusitis are related to a viral infection and will resolve on their own within 10 days. Sometimes medicines are prescribed to help relieve symptoms (pain medicine, decongestants, nasal steroid sprays, or saline sprays).  However, for sinusitis related to a bacterial infection, your health care provider will prescribe antibiotic medicines. These are medicines that will help kill the bacteria causing the infection.  Rarely, sinusitis is caused by a fungal infection. In theses cases, your health care provider will prescribe antifungal medicine. For some cases of chronic sinusitis, surgery is needed. Generally, these are cases in which sinusitis recurs more than 3 times per year, despite other treatments. HOME CARE INSTRUCTIONS   Drink plenty of water. Water helps thin the mucus so your sinuses can drain more easily.  Use a humidifier.  Inhale steam 3 to 4 times a day (for example, sit in the bathroom with the shower running).  Apply a warm,  moist washcloth to your face 3 to 4 times a day, or as directed by your health care provider.  Use saline nasal sprays to help moisten and clean your sinuses.  Take medicines only as directed by your health care provider.  If you were prescribed either an antibiotic or antifungal medicine, finish it all even if you start to feel better. SEEK IMMEDIATE MEDICAL CARE IF:  You have increasing pain or severe headaches.  You have nausea, vomiting, or drowsiness.  You have swelling around your face.  You have vision problems.  You have a stiff neck.  You have difficulty breathing. MAKE SURE YOU:   Understand these instructions.  Will watch your condition.  Will get help right away if you are not doing well or get worse. Document Released: 12/12/2005 Document Revised: 04/28/2014 Document Reviewed: 12/27/2011 [facility] Patient Information 2015 [place], [place]. This information is not intended to replace advice given to you by your health care provider. Make sure you discuss any questions you have with your health care provider.  Emergency Department Resource Guide 1) Find a Doctor and Pay Out of Pocket Although you won't have to find out who is covered by your insurance plan, it is a good idea to ask around and get recommendations. You will then  need to call the office and see if the doctor you have chosen will accept you as a new patient and what types of options they offer for patients who are self-pay. Some doctors offer discounts or will set up payment plans for their patients who do not have insurance, but you will need to ask so you aren't surprised when you get to your appointment.  2) Contact Your Local Health Department Not all health departments have doctors that can see patients for sick visits, but many do, so it is worth a call to see if yours does. If you don't know where your local health department is, you can check in your phone book. The CDC also has a tool to help you  locate your state's health department, and many state websites also have listings of all of their local health departments.  3) Find a Walk-in Clinic If your illness is not likely to be very severe or complicated, you may want to try a walk in clinic. These are popping up all over the country in pharmacies, drugstores, and shopping centers. They're usually staffed by nurse practitioners or physician assistants that have been trained to treat common illnesses and complaints. They're usually fairly quick and inexpensive. However, if you have serious medical issues or chronic medical problems, these are probably not your best option.  No Primary Care Doctor: - Call Health Connect at  [phone] - they can help you locate a primary care doctor that  accepts your insurance, provides certain services, etc. - Physician Referral Service- [phone]  Chronic Pain Problems: Organization         Address  Phone   Notes  [facility]  [phone] Patients need to be referred by their primary care doctor.   Medication Assistance: Organization         Address  Phone   Notes  [facility] Medication [facility] [address] [place] [place], [place] [address] [phone] --Must be a resident of [facility] -- Must have NO insurance coverage whatsoever (no Medicaid/ Medicare, etc.) -- The pt. MUST have a primary care doctor that directs their care regularly and follows them in the community   [facility]  [phone]   [employer]  [phone]    Agencies that provide inexpensive medical care: Organization         Address  Phone   Notes  [facility]  [phone]   [facility]    [phone]   [facility] [address] [place], [place] [address] [phone]   [facility] [address] [place] [phone]   [facility]anned Parenthood    [phone]   [facility]    [phone]   [facility]  [address], [place] Phone:  [phone], Fax:  [phone] Hours of Operation:  9 am - 6 pm, M-F.  Also accepts Medicaid/Medicare and self-pay.  [facility]  [address], [place] Phone: [phone], Fax: [phone]. Hours of Operation:  8:30 am - 5:30 pm, M-F.  Also accepts Medicaid and self-pay.  [facility] [place] [address] [place] Phone: [phone]   [facility] [address] Natasha Bence [place], [place] [phone], Ext. [phone] Mondays & Thursdays: 7-9 AM.  First 15 patients are seen on a first come, first serve basis.  Medicaid-accepting [facility] [place] Providers:  Organization         Address  Phone   Notes  [facility] [address], [place] [phone] Also accepts self-pay patients.  [facility] [address], [place]  [phone]) [phone]   [facility] [address], [place] [phone]) [phone]   [facility] [address] [place] [address], [place] [phone]   Renaye RakersVeita Bland [address], [place]   [phone] Only accepts [place] Access [place] patients after they have their name applied to their card.   Self-Pay (no insurance) in [facility] [place]:  Organization         Address  Phone   Notes  Sickle Cell Patients, [facility] [address], [place] [phone]   [facility] [address], [place] [phone]) [phone]   [facility] [place]  [address], [place] [phone]   [facility]/Dr. Osei-Bonsu  [address] [place] [address], [place] or [phone], [place] [phone] Phone number for both [place] and [place] locations is the same.  [facility] [address],  [place] [phone]) [phone]   [facility] [address] [place] [address], [place] or [address] [phone]   [facility] [address], [place] [phone], phone; [phone], fax Sees patients 1st and 3rd Saturday of every month.  Must not qualify for public or private insurance (i.e. Medicaid, Medicare, [place] Health Choice, Veterans' Benefits)  Household income should be no more than 200% of the poverty level The clinic cannot treat you if you are pregnant or think you are pregnant  Sexually transmitted diseases are not treated at the clinic.    Dental Care: Organization         Address  Phone  Notes  [facility] [address], [place] [phone] Accepts children up to age 38 who are enrolled in [place] or [place] Health Choice; pregnant women with a Medicaid card; and children who have applied for Medicaid or [place] Health Choice, but were declined, whose parents can pay a reduced fee at time of service.  [facility] [place]  [address], [place] [phone] Accepts children up to age 38 who are enrolled in [place] or [place] Health Choice; pregnant women with a Medicaid card; and children who have applied for Medicaid or [place] Health Choice, but were declined, whose parents can pay a reduced fee at time of service.  [facility]  [address], [place] [phone] Patients are seen by appointment only. Walk-ins are not accepted. [facility] will see patients 318 years of age and older. Monday - Tuesday (8am-5pm) Most Wednesdays (8:30-5pm) $30 per visit, cash only  [facility]  [address], [facility] [phone] Patients are seen by appointment only. Walk-ins are not accepted. [facility] will see patients 38 years of age and older. One Wednesday Evening  (Monthly: Volunteer Based).  $30 per visit, cash only  [facility]  [phone]) [phone] for adults; Children under age 674, call Graduate Pediatric Dentistry at [phone]. Children aged 38-14, please call [phone] to request a  pediatric application.  Dental services are provided in all areas of dental care including fillings, crowns and bridges, complete and partial dentures, implants, gum treatment, root canals, and extractions. Preventive care is also provided. Treatment is provided to both adults and children. Patients are selected via a lottery and there is often a waiting list.   [facility] [address], [place]  [phone] [email or web address]   [facility] [address], [place], [place] [phone], Ext. [phone] Second and Fourth Thursday of each month, opens at 6:30 AM; Clinic ends at 9 AM.  Patients are seen on a first-come first-served basis, and a limited number are seen during each clinic.   [facility]  [address], [place], [place] [phone]   Eligibility Requirements You must have lived in [place], [place], or [place] counties for at least the last three months.   You cannot be eligible for state or federal sponsored [place]healthcare insurance, including [employer], [place]icaid, or [employer]icare.   You generally cannot be eligible for healthcare insurance through your employer.    How to apply: Eligibility screenings are held every Tuesday and Wednesday afternoon from 1:00 pm until 4:00 pm. You do not need an appointment for the interview!  [facility] [address], [place], [place] [phone]   [facility]  [phone]   [facility]  [phone]   [facility]  [phone]    Behavioral Health Resources in the Community: Intensive Outpatient Programs Organization         Address  Phone  Notes  [facility] [address], [place], [place] [phone]   [facility] [address], [place], [place] [phone]   [facility] [address], [place], [place]  [phone]   [facility] [address],  [place], [place] [phone] or [phone]   Substance Abuse Resources Organization         Address  Phone  Notes  [facility]  [phone]   [facility]  [phone]   [facility]  [phone]   [facility]  [phone]   Residential & Outpatient Substance Abuse Program  [phone]   Psychological Services Organization         Address  Phone  Notes  [facility]  [phone]- [phone]   [facility]  [phone]- [phone]   [facility] [address], [place] [phone] or [phone]    Mobile Crisis Teams Organization         Address  Phone  Notes  [facility], Mobile Crisis Care Unit  [phone]   [facility]  [address] [place], [place] [phone]   Doristine LocksSharon DeEsch [address] [place] [phone]    Self-Help/Support Groups Organization         Address  Phone             Notes  [facility] [place] - variety of support groups  336- [phone] Call for more information  Narcotics Anonymous (NA), [facility] [address], [place]  2 meetings at this location   [facility] Treatment Programs Organization         Address  Phone  Notes  [facility] [address],    [place]  [phone]   [facility]  [address], [place] [phone],  [place], [place] [phone]   [facility] [address] [place], [place] [phone] Admissions: 8am-3pm M-F  [facility] [address]    [place], [place] [phone]   [facility] [address] [place],  [place], [place] [phone]   [facility] [address]  [place], [place] [phone]   [facility] - Intensive Outpatient [address], [place], [place] [phone]   [facility] ([facility] [address] [place]  [place], [place] [phone] or [phone]   [facility] ([facility]) [address] [place], [place] [phone] Accepts Medicaid  [facility] [address]  [place] [phone] Substance Abuse/Addiction Treatment   [facility] Organization         Address  Phone  Notes  [facility]  [phone]   Angie Fava, PhD [address] [place], [place]   [phone] or [phone]   [facility]   [address] [place], [place] [phone]   [facility] [address], [place], [place] [phone] Insurance/Medicaid/sponsorship through [facility] Families [address]                                    [place], [place] [phone] Therapy/tele-psych/case  [facility] [address], [place] [phone]    Dr. Lolly Mustache  [phone]   [facility]  [employer] [facility] Dept. 1) [address], [place] 2) [address], [place] 3)  [address], [place] [phone]  [phone]   [facility] Child Abuse Hotline [phone] or [phone] (After Hours)

## 2015-07-08 NOTE — ED Notes (Signed)
C/o headache and ear pain for a week

## 2015-08-10 ENCOUNTER — Encounter (HOSPITAL_COMMUNITY): Payer: Self-pay | Admitting: *Deleted

## 2015-08-10 ENCOUNTER — Emergency Department (INDEPENDENT_AMBULATORY_CARE_PROVIDER_SITE_OTHER)
Admission: EM | Admit: 2015-08-10 | Discharge: 2015-08-10 | Disposition: A | Discharge status: Home / Self Care | Payer: Self-pay | Source: Home / Self Care | Attending: Family Medicine | Admitting: Family Medicine

## 2015-08-10 DIAGNOSIS — K219 Gastro-esophageal reflux disease without esophagitis: Secondary | ICD-10-CM

## 2015-08-10 DIAGNOSIS — G501 Atypical facial pain: Secondary | ICD-10-CM

## 2015-08-10 MED ORDER — RANITIDINE HCL 150 MG PO TABS: 150.0000 mg | ORAL_TABLET | Freq: Two times a day (BID) | ORAL | Status: DC

## 2015-08-10 NOTE — ED Provider Notes (Signed)
CSN: [id number]     Arrival date & time 08/10/15  1816 History   First MD Initiated Contact with Patient 08/10/15 1943     Chief Complaint  Patient presents with  . Otalgia   (Consider location/radiation/quality/duration/timing/severity/associated sxs/prior Treatment) Patient is a 39 y.o. female presenting with ear pain. The history is provided by the patient.  Otalgia Location:  Left Behind ear:  No abnormality Quality:  Sharp Severity:  Moderate Onset quality:  Gradual Duration:  1 month Progression:  Unchanged Chronicity:  Recurrent Context comment:  Seen for similar sx 40mo ago and dx with sinusitis, at [facility], given meds but sx continue Associated symptoms: headaches   Associated symptoms: no ear discharge, no fever, no rash, no rhinorrhea and no sore throat     Past Medical History  Diagnosis Date  . No significant past medical history    Past Surgical History  Procedure Laterality Date  . Cesarean section     History reviewed. No pertinent family history. Social History  Substance Use Topics  . Smoking status: Never Smoker   . Smokeless tobacco: None  . Alcohol Use: Yes   OB History    No data available     Review of Systems  Constitutional: Negative.  Negative for fever.  HENT: Positive for ear pain. Negative for ear discharge, facial swelling, postnasal drip, rhinorrhea and sore throat.   Respiratory: Negative.   Cardiovascular: Negative.   Skin: Negative for rash.  Neurological: Positive for headaches.    Allergies  Review of patient's allergies indicates no known allergies.  Home Medications   Prior to Admission medications   Medication Sig Start Date End Date Taking? Authorizing Provider  amoxicillin-clavulanate (AUGMENTIN) 875-125 MG per tablet Take 1 tablet by mouth 2 (two) times daily. 07/08/15   Elson Areas, PA-C  cetirizine (ZYRTEC) 10 MG tablet Take 1 tablet (10 mg total) by mouth daily. 06/11/15   Drema Pry, MD  HYDROcodone-acetaminophen  (NORCO/VICODIN) 5-325 MG per tablet Take 2 tablets by mouth every 4 (four) hours as needed. 07/08/15   Elson Areas, PA-C   BP 138/93 mmHg  Pulse 91  Temp(Src) 98.4 F (36.9 C) (Oral)  Resp 18  SpO2 99% Physical Exam  Constitutional: She is oriented to person, place, and time. She appears well-developed and well-nourished. No distress.  HENT:  Head: Normocephalic.  Right Ear: External ear normal.  Left Ear: External ear normal.  Mouth/Throat: Oropharynx is clear and moist.  Eyes: Conjunctivae are normal. Pupils are equal, round, and reactive to light.  Neck: Normal range of motion. Neck supple.  Cardiovascular: Normal heart sounds.   Pulmonary/Chest: Effort normal and breath sounds normal.  Lymphadenopathy:    She has no cervical adenopathy.  Neurological: She is alert and oriented to person, place, and time.  Skin: Skin is warm and dry.  Nursing note and vitals reviewed.   ED Course  Procedures (including critical care time) Labs Review Labs Reviewed - No data to display  Imaging Review No results found.   MDM  No diagnosis found.    Linna Hoff, MD 08/10/15 [phone]

## 2015-08-10 NOTE — ED Notes (Signed)
Pt  Reports  l  Earache  With  l  Side  Face   Hurting    As   Well  As  abd  Pain  Nausea      No  Vomiting      Symptoms  X  2   Weeks   -   Pt  states  Was  Treated  For  Sinus  Infection  sev  Weeks  Ago

## 2015-08-10 NOTE — Discharge Instructions (Signed)
Use medicine for stomach as prescribed and see ear/ throat doctor for pain if continues.

## 2016-01-05 ENCOUNTER — Encounter (HOSPITAL_COMMUNITY): Payer: Self-pay | Admitting: Emergency Medicine

## 2016-01-05 ENCOUNTER — Emergency Department (HOSPITAL_COMMUNITY)
Admission: EM | Admit: 2016-01-05 | Discharge: 2016-01-05 | Disposition: A | Discharge status: Home / Self Care | Payer: Self-pay | Attending: Emergency Medicine | Admitting: Emergency Medicine

## 2016-01-05 DIAGNOSIS — K002 Abnormalities of size and form of teeth: Secondary | ICD-10-CM | POA: Insufficient documentation

## 2016-01-05 DIAGNOSIS — Z79899 Other long term (current) drug therapy: Secondary | ICD-10-CM | POA: Insufficient documentation

## 2016-01-05 DIAGNOSIS — Z792 Long term (current) use of antibiotics: Secondary | ICD-10-CM | POA: Insufficient documentation

## 2016-01-05 DIAGNOSIS — R600 Localized edema: Secondary | ICD-10-CM

## 2016-01-05 DIAGNOSIS — R609 Edema, unspecified: Secondary | ICD-10-CM

## 2016-01-05 DIAGNOSIS — K029 Dental caries, unspecified: Secondary | ICD-10-CM | POA: Insufficient documentation

## 2016-01-05 DIAGNOSIS — M542 Cervicalgia: Secondary | ICD-10-CM | POA: Insufficient documentation

## 2016-01-05 DIAGNOSIS — K118 Other diseases of salivary glands: Secondary | ICD-10-CM | POA: Insufficient documentation

## 2016-01-05 MED ORDER — CLINDAMYCIN HCL 150 MG PO CAPS: 300.0000 mg | ORAL_CAPSULE | Freq: Four times a day (QID) | ORAL | Status: DC

## 2016-01-05 NOTE — ED Notes (Signed)
See PA note for secondary assessment.   

## 2016-01-05 NOTE — ED Provider Notes (Signed)
CSN: [id number]     Arrival date & time 01/05/16  1647 History  By signing my name below, I, Tanda RockersMargaux Venter, attest that this documentation has been prepared under the direction and in the presence of Wal-MartJosh Rishon Thilges, PA-C. Electronically Signed: Tanda RockersMargaux Venter, ED Scribe. 01/05/2016. 5:17 PM.   Chief Complaint  Patient presents with  . Facial Swelling   The history is provided by the patient. No language interpreter was used.     HPI Comments: Suzanne Ramos is a 40 y.o. female who presents to the Emergency Department complaining of gradual onset, constant, right jaw swelling x 2 days. Pt mentions that she noticed a small knot to the right side of her jaw that has gradually increased in size. Pt also complains of pain to the right side of her neck. She took Ibuprofen earlier in the day with some relief. The pain is exacerbated with turning her head to the left, lifting her head up, and laying on her right side. Pt reports that she did have a recent cold and first attributed the swelling to an enlarged lymph node. Denies dental pain, fever, chills, or any other associated symptoms.    Past Medical History  Diagnosis Date  . No significant past medical history    Past Surgical History  Procedure Laterality Date  . Cesarean section     History reviewed. No pertinent family history. Social History  Substance Use Topics  . Smoking status: Never Smoker   . Smokeless tobacco: None  . Alcohol Use: Yes   OB History    No data available     Review of Systems  Constitutional: Negative for fever and chills.  HENT: Positive for facial swelling. Negative for dental problem, ear pain, sore throat and trouble swallowing.   Respiratory: Negative for shortness of breath and stridor.   Musculoskeletal: Positive for neck pain.  Skin: Negative for color change.  Neurological: Negative for headaches.   Allergies  Review of patient's allergies indicates no known allergies.  Home Medications    Prior to Admission medications   Medication Sig Start Date End Date Taking? Authorizing Provider  amoxicillin-clavulanate (AUGMENTIN) 875-125 MG per tablet Take 1 tablet by mouth 2 (two) times daily. 07/08/15   Elson AreasLeslie K Sofia, PA-C  cetirizine (ZYRTEC) 10 MG tablet Take 1 tablet (10 mg total) by mouth daily. 06/11/15   Drema PryPedro Cardama, MD  HYDROcodone-acetaminophen (NORCO/VICODIN) 5-325 MG per tablet Take 2 tablets by mouth every 4 (four) hours as needed. 07/08/15   Elson AreasLeslie K Sofia, PA-C  ranitidine (ZANTAC) 150 MG tablet Take 1 tablet (150 mg total) by mouth 2 (two) times daily. 08/10/15   Linna HoffJames D Kindl, MD   Triage Vitals:  BP 128/90 mmHg  Pulse 97  Temp(Src) 98.4 F (36.9 C) (Oral)  Resp 18  SpO2 97%   Physical Exam  Constitutional: She is oriented to person, place, and time. She appears well-developed and well-nourished. No distress.  HENT:  Head: Normocephalic and atraumatic.  Right Ear: Tympanic membrane, external ear and ear canal normal.  Left Ear: Tympanic membrane, external ear and ear canal normal.  Nose: Nose normal.  Mouth/Throat: Uvula is midline, oropharynx is clear and moist and mucous membranes are normal. No trismus in the jaw. Abnormal dentition. Dental caries present. No dental abscesses or uvula swelling. No tonsillar abscesses.  Tenderness and firmness over the right angle of the jaw at the area of right-sided submandibular salivary gland. No overlying erythema. Tenderness is mild. Patient with several teeth  in poor repair but no obvious dental abscess.  Eyes: Conjunctivae and EOM are normal.  Neck: Normal range of motion. Neck supple. No tracheal deviation present.  No neck swelling or Ludwig's angina  Cardiovascular: Normal rate.   Pulmonary/Chest: Effort normal. No respiratory distress.  Musculoskeletal: Normal range of motion.  Lymphadenopathy:    She has no cervical adenopathy.  Neurological: She is alert and oriented to person, place, and time.  Skin: Skin is  warm and dry.  Psychiatric: She has a normal mood and affect. Her behavior is normal.  Nursing note and vitals reviewed.   ED Course  Procedures (including critical care time)  DIAGNOSTIC STUDIES: Oxygen Saturation is 97% on RA, normal by my interpretation.    COORDINATION OF CARE: 5:15 PM-Discussed treatment plan which includes Rx antibiotics with pt at bedside and pt agreed to plan. Advised pt to eat sour candy or drink sour liquid to increase salivation incase swelling is from a blocked salivary gland.   Labs Review Labs Reviewed - No data to display  Imaging Review No results found.   EKG Interpretation None       Vital signs reviewed and are as follows: Filed Vitals:   01/05/16 1706  BP: 128/90  Pulse: 97  Temp: 98.4 F (36.9 C)  Resp: 18   Return with fever, worsening swelling, trouble breathing or swallowing. Patient verbalizes understanding and agrees with plan.   MDM   Final diagnoses:  Salivary gland swelling   Patient with likely blocked right submandibular salivary gland leading to swelling. Cannot entirely rule out salivary gland infection, dental abscess, reactive or infected lymph node. Patient appears well, nontoxic. No concerning features which necessitate imaging with CT at this time. Will start on clindamycin and have patient use sour liquids to help clear possible blocked duct. Return instructions as above. No concern for respiratory or airway compromise.  I personally performed the services described in this documentation, which was scribed in my presence. The recorded information has been reviewed and is accurate.      Renne Crigler, PA-C 01/05/16 1743  Loren Racer, MD 01/05/16 [phone]

## 2016-01-05 NOTE — Discharge Instructions (Signed)
Please read and follow all provided instructions.  Your diagnoses today include:  1. Salivary gland swelling    Tests performed today include:  Vital signs. See below for your results today.   Medications prescribed:   Clindamycin - antibiotic  You have been prescribed an antibiotic medicine: take the entire course of medicine even if you are feeling better. Stopping early can cause the antibiotic not to work.  Take any prescribed medications only as directed.  Home care instructions:  Follow any educational materials contained in this packet.  BE VERY CAREFUL not to take multiple medicines containing Tylenol (also called acetaminophen). Doing so can lead to an overdose which can damage your liver and cause liver failure and possibly death.   Follow-up instructions: Please follow-up with your primary care provider in the next 3 days for further evaluation of your symptoms.   Return instructions:   Please return to the Emergency Department if you experience worsening symptoms.   Return with worsening swelling, fever, trouble breathing or swallowing  Please return if you have any other emergent concerns.  Additional Information:  Your vital signs today were: BP 128/90 mmHg   Pulse 97   Temp(Src) 98.4 F (36.9 C) (Oral)   Resp 18   SpO2 97% If your blood pressure (BP) was elevated above 135/85 this visit, please have this repeated by your doctor within one month. --------------

## 2016-01-05 NOTE — ED Notes (Signed)
Pt with swollen tender area to right side of face under chin; pt unsure of cause

## 2016-07-02 ENCOUNTER — Emergency Department (HOSPITAL_COMMUNITY)
Admission: EM | Admit: 2016-07-02 | Discharge: 2016-07-02 | Disposition: A | Discharge status: Home / Self Care | Payer: No Typology Code available for payment source | Attending: Emergency Medicine | Admitting: Emergency Medicine

## 2016-07-02 ENCOUNTER — Encounter (HOSPITAL_COMMUNITY): Payer: Self-pay | Admitting: Emergency Medicine

## 2016-07-02 DIAGNOSIS — Y999 Unspecified external cause status: Secondary | ICD-10-CM | POA: Insufficient documentation

## 2016-07-02 DIAGNOSIS — M542 Cervicalgia: Secondary | ICD-10-CM

## 2016-07-02 DIAGNOSIS — Y939 Activity, unspecified: Secondary | ICD-10-CM | POA: Diagnosis not present

## 2016-07-02 DIAGNOSIS — M25512 Pain in left shoulder: Secondary | ICD-10-CM | POA: Diagnosis present

## 2016-07-02 DIAGNOSIS — Y92410 Unspecified street and highway as the place of occurrence of the external cause: Secondary | ICD-10-CM | POA: Insufficient documentation

## 2016-07-02 MED ORDER — NAPROXEN 250 MG PO TABS: 250.0000 mg | ORAL_TABLET | Freq: Two times a day (BID) | ORAL | Status: DC
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
## 2016-10-13 ENCOUNTER — Ambulatory Visit (HOSPITAL_COMMUNITY)
Admission: EM | Admit: 2016-10-13 | Discharge: 2016-10-13 | Disposition: A | Discharge status: Home / Self Care | Payer: Self-pay | Attending: Family Medicine | Admitting: Family Medicine

## 2016-10-13 ENCOUNTER — Encounter (HOSPITAL_COMMUNITY): Payer: Self-pay | Admitting: Emergency Medicine

## 2016-10-13 DIAGNOSIS — N390 Urinary tract infection, site not specified: Secondary | ICD-10-CM

## 2016-10-13 LAB — POCT URINALYSIS DIP (DEVICE)
Bilirubin Urine: NEGATIVE
GLUCOSE, UA: NEGATIVE mg/dL
KETONES UR: NEGATIVE mg/dL
Nitrite: POSITIVE — AB
PROTEIN: 30 mg/dL — AB
UROBILINOGEN UA: 0.2 mg/dL (ref 0.0–1.0)
pH: 6 (ref 5.0–8.0)

## 2016-10-13 LAB — POCT PREGNANCY, URINE: Preg Test, Ur: NEGATIVE

## 2016-10-13 MED ORDER — CEPHALEXIN 500 MG PO CAPS: 500.0000 mg | ORAL_CAPSULE | Freq: Four times a day (QID) | ORAL | 0 refills | Status: DC

## 2016-10-13 NOTE — ED Provider Notes (Signed)
CSN: [id number]     Arrival date & time 10/13/16  1841 History   First MD Initiated Contact with Patient 10/13/16 1916     Chief Complaint  Patient presents with  . Urinary Tract Infection   (Consider location/radiation/quality/duration/timing/severity/associated sxs/prior Treatment) 40 year old female complaining of dysuria for the past 2-3 days associated with decreased in urine via and urinary urgency. Saw also having vaginal itching and occasional pelvic cramping. She states that she has a Mirena IUD that "should have been removed 2 years ago".      Past Medical History:  Diagnosis Date  . No significant past medical history    Past Surgical History:  Procedure Laterality Date  . CESAREAN SECTION     History reviewed. No pertinent family history. Social History  Substance Use Topics  . Smoking status: Never Smoker  . Smokeless tobacco: Never Used  . Alcohol use Yes     Comment: occasionally   OB History    No data available     Review of Systems  Respiratory: Negative.   Gastrointestinal: Positive for nausea. Negative for abdominal pain and vomiting.  Genitourinary: Positive for decreased urine volume, dysuria, frequency and urgency. Negative for vaginal bleeding.  Skin: Negative.   All other systems reviewed and are negative.   Allergies  Review of patient's allergies indicates no known allergies.  Home Medications   Prior to Admission medications   Medication Sig Start Date End Date Taking? Authorizing Provider  cephALEXin (KEFLEX) 500 MG capsule Take 1 capsule (500 mg total) by mouth 4 (four) times daily. 10/13/16   Hayden Rasmussenavid Dalaney Needle, NP  cetirizine (ZYRTEC) 10 MG tablet Take 1 tablet (10 mg total) by mouth daily. 06/11/15   Nira ConnPedro Eduardo Cardama, MD  ranitidine (ZANTAC) 150 MG tablet Take 1 tablet (150 mg total) by mouth 2 (two) times daily. 08/10/15   Linna HoffJames D Kindl, MD   Meds Ordered and Administered this Visit  Medications - No data to display  BP 149/98 (BP  Location: Right Arm)   Pulse 87   Temp 98.3 F (36.8 C) (Oral)   SpO2 100%  No data found.   Physical Exam  Constitutional: She is oriented to person, place, and time. She appears well-developed and well-nourished. No distress.  Eyes: EOM are normal.  Neck: Normal range of motion. Neck supple.  Cardiovascular: Normal rate.   Pulmonary/Chest: Effort normal. No respiratory distress.  Abdominal: Soft. She exhibits no distension and no mass. There is no tenderness. There is no rebound and no guarding. No hernia.  Musculoskeletal: She exhibits no edema.  Neurological: She is alert and oriented to person, place, and time. She exhibits normal muscle tone.  Skin: Skin is warm and dry.  Psychiatric: She has a normal mood and affect.  Nursing note and vitals reviewed.   Urgent Care Course   Clinical Course    Procedures (including critical care time)  Labs Review Labs Reviewed  POCT URINALYSIS DIP (DEVICE) - Abnormal; Notable for the following:       Result Value   Hgb urine dipstick MODERATE (*)    Protein, ur 30 (*)    Nitrite POSITIVE (*)    Leukocytes, UA TRACE (*)    All other components within normal limits  POCT PREGNANCY, URINE    Imaging Review No results found.   Visual Acuity Review  Right Eye Distance:   Left Eye Distance:   Bilateral Distance:    Right Eye Near:   Left Eye Near:  Bilateral Near:         MDM   1. Lower urinary tract infectious disease    Take your medication as prescribed. Drink plenty of fluids. For urinary symptoms may take Azo for the next 2 days. A culture will be obtained. Meds ordered this encounter  Medications  . cephALEXin (KEFLEX) 500 MG capsule    Sig: Take 1 capsule (500 mg total) by mouth 4 (four) times daily.    Dispense:  28 capsule    Refill:  0    Order Specific Question:   Supervising Provider    Answer:   Linna Hoff [[id number]]   Urine cult pending    Hayden Rasmussen, NP 10/13/16 2033

## 2016-10-13 NOTE — Discharge Instructions (Signed)
Take your medication as prescribed. Drink plenty of fluids. For urinary symptoms may take Azo for the next 2 days. A culture will be obtained.

## 2016-10-13 NOTE — ED Triage Notes (Signed)
Pt has been suffering from lower abdominal cramping, frequency of urination and some pain and itching with urination.  She denies any fever.

## 2017-02-01 ENCOUNTER — Ambulatory Visit (HOSPITAL_COMMUNITY)
Admission: EM | Admit: 2017-02-01 | Discharge: 2017-02-01 | Disposition: A | Discharge status: Home / Self Care | Payer: Self-pay | Attending: Family Medicine | Admitting: Family Medicine

## 2017-02-01 ENCOUNTER — Encounter (HOSPITAL_COMMUNITY): Payer: Self-pay | Admitting: Emergency Medicine

## 2017-02-01 DIAGNOSIS — K029 Dental caries, unspecified: Secondary | ICD-10-CM

## 2017-02-01 DIAGNOSIS — R590 Localized enlarged lymph nodes: Secondary | ICD-10-CM

## 2017-02-01 DIAGNOSIS — K0889 Other specified disorders of teeth and supporting structures: Secondary | ICD-10-CM

## 2017-02-01 MED ORDER — CLINDAMYCIN HCL 300 MG PO CAPS: 300.0000 mg | ORAL_CAPSULE | Freq: Three times a day (TID) | ORAL | 0 refills | Status: AC

## 2017-02-01 NOTE — Discharge Instructions (Signed)
I think the pain in the left side in your neck is soreness due to swollen lymph nodes. This is often due to a reaction to infection. You have teeth that are certainly decayed and loose. This is likely the source for infection. Your throat looks healthy, your ear looks normal and there are no other sources of infection that can be seen. See a dentist is sent is possible. Make take ibuprofen or Tylenol for discomfort and take the clindamycin as directed for infection.

## 2017-02-01 NOTE — ED Provider Notes (Signed)
CSN: [id number]     Arrival date & time 02/01/17  1809 History   First MD Initiated Contact with Patient 02/01/17 1854     Chief Complaint  Patient presents with  . Sore Throat  . Otalgia  . Dental Problem   (Consider location/radiation/quality/duration/timing/severity/associated sxs/prior Treatment) 41 year old female complaining of sore throat for 2 days then left earache and left facial pain. She states she got a little better but still has some soreness left. Patient actually points to the left lower mandible and left anterior neck as the source of discomfort. She states she cannot tell whether she has a tooth infection or not. She has reported dentition, multiple caries and other dental problems. She does not have a dentist and appears to have not seen a dentist in several years.      Past Medical History:  Diagnosis Date  . No significant past medical history    Past Surgical History:  Procedure Laterality Date  . CESAREAN SECTION     No family history on file. Social History  Substance Use Topics  . Smoking status: Never Smoker  . Smokeless tobacco: Never Used  . Alcohol use Yes     Comment: occasionally   OB History    No data available     Review of Systems  Constitutional: Negative.   HENT: Positive for dental problem and sore throat. Negative for ear discharge, ear pain, postnasal drip and rhinorrhea.   Eyes: Negative.   Respiratory: Negative.   Cardiovascular: Negative.   Gastrointestinal: Negative.   Neurological: Negative.   All other systems reviewed and are negative.   Allergies  Patient has no known allergies.  Home Medications   Prior to Admission medications   Medication Sig Start Date End Date Taking? Authorizing Provider  clindamycin (CLEOCIN) 300 MG capsule Take 1 capsule (300 mg total) by mouth 3 (three) times daily. 02/01/17   Hayden Rasmussen, NP   Meds Ordered and Administered this Visit  Medications - No data to display  BP 135/92 (BP  Location: Right Arm)   Pulse 96   Temp 98.5 F (36.9 C) (Oral)   Resp 17   Ht 5\' 3"  (1.6 m)   Wt 186 lb (84.4 kg)   SpO2 98%   BMI 32.95 kg/m  No data found.   Physical Exam  Constitutional: She is oriented to person, place, and time. She appears well-developed and well-nourished. No distress.  HENT:  Head: Normocephalic and atraumatic.  Right Ear: External ear normal.  Left Ear: External ear normal.  Mouth/Throat: Oropharynx is clear and moist.  Oropharynx is clear with the exception of minimal clear drainage. No swelling or exudates.  Left TM and right TM are normal appearing. No tenderness to the ear or left face.  Positive tenderness to the row of left lower teeth including one bicuspid that is quite loose and tender. No abscess formation is seen.  Eyes: EOM are normal.  Neck: Normal range of motion. Neck supple.  Few small tender left anterior cervical lymph nodes with minor enlargement.  Cardiovascular: Normal rate.   Pulmonary/Chest: Effort normal and breath sounds normal.  Lymphadenopathy:    She has cervical adenopathy.  Neurological: She is alert and oriented to person, place, and time.  Skin: Skin is warm and dry.  Psychiatric: She has a normal mood and affect.  Nursing note and vitals reviewed.   Urgent Care Course     Procedures (including critical care time)  Labs Review Labs Reviewed - No  data to display  Imaging Review No results found.   Visual Acuity Review  Right Eye Distance:   Left Eye Distance:   Bilateral Distance:    Right Eye Near:   Left Eye Near:    Bilateral Near:         MDM   1. Dental caries   2. Left cervical lymphadenopathy   3. Tooth loose    Patient states she is allergic to penicillin so clindamycin was chosen. I think the pain in the left side in your neck is soreness due to swollen lymph nodes. This is often due to a reaction to infection. You have teeth that are certainly decayed and loose. This is likely  the source for infection. Your throat looks healthy, your ear looks normal and there are no other sources of infection that can be seen. See a dentist is sent is possible. Make take ibuprofen or Tylenol for discomfort and take the clindamycin as directed for infection.     Hayden Rasmussenavid Blane Worthington, NP 02/01/17 [id number]

## 2017-02-01 NOTE — ED Triage Notes (Signed)
Pt. Stated, I've had a sore throat, ear pain and not sure if its not because of a toothache.

## 2018-02-12 ENCOUNTER — Other Ambulatory Visit: Payer: Self-pay | Admitting: Family Medicine

## 2018-02-12 ENCOUNTER — Other Ambulatory Visit (HOSPITAL_COMMUNITY)
Admission: RE | Admit: 2018-02-12 | Discharge: 2018-02-12 | Disposition: A | Discharge status: Home / Self Care | Payer: BC Managed Care – PPO | Source: Ambulatory Visit | Attending: Family Medicine | Admitting: Family Medicine

## 2018-02-12 DIAGNOSIS — Z124 Encounter for screening for malignant neoplasm of cervix: Secondary | ICD-10-CM | POA: Diagnosis not present

## 2018-02-13 LAB — CYTOLOGY - PAP
Chlamydia: NEGATIVE
Diagnosis: NEGATIVE
HPV: NOT DETECTED
NEISSERIA GONORRHEA: NEGATIVE

## 2018-11-28 ENCOUNTER — Other Ambulatory Visit: Payer: Self-pay | Admitting: Family Medicine

## 2018-11-28 DIAGNOSIS — Z1231 Encounter for screening mammogram for malignant neoplasm of breast: Secondary | ICD-10-CM

## 2018-12-27 ENCOUNTER — Encounter: Payer: Self-pay | Admitting: Registered"

## 2018-12-27 ENCOUNTER — Encounter: Payer: BC Managed Care – PPO | Attending: Family Medicine | Admitting: Registered"

## 2018-12-27 DIAGNOSIS — R7303 Prediabetes: Secondary | ICD-10-CM | POA: Diagnosis present

## 2018-12-27 NOTE — Patient Instructions (Addendum)
Instructions/Goals:  Make sure to get in three meals per day. Try to have balanced meals like the My Plate example (see handout). Include lean proteins, vegetables, fruits, and whole grains at meals.    Goal: Have 3 meals each day/eat every 3-5 hours.    Starting Goal: Reduce soda intake to 12-24 oz per day. Include at least 64 oz water daily.  Recommend taking 4000-5000 IU vitamin D daily due to low vitamin D.   Make physical activity a part of your week. Try to include at least 30 minutes of physical activity 5 days each week or at least 150 minutes per week. Regular physical activity promotes overall health-including helping to reduce risk for heart disease and diabetes, promoting mental health, and helping [place] sleep better.

## 2018-12-27 NOTE — Progress Notes (Signed)
Medical Nutrition Therapy:  Appt start time: 1440 end time:  1540.  Assessment:  Primary concerns today: Pt referred due to prediabetes. Pt reports her main issue is frequent snacking. Pt reports she wants to start meal planning and feels that will help her cut down on snacking. Pt would like help with healthier and affordable alternatives to snacking. Pt works 12 hours 5 days a week. 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 Pt reports that she usually goes to sleep around 11 PM-midnight and wakes up around 4-4:30 AM. Pt reports she has taken prescribed vitamin D supplements in the past due to vitamin D deficiency. Reports she plans to but has not yet purchased over the counter vitamin D to take.   Noted Lab Values: 12/19 Hgb A1c: 6.0 Vitamin D: 18.4  Food Allergies/Intolerances: tomatoes  Preferred Learning Style:  No preference indicated   Learning Readiness:   Ready  MEDICATIONS: None reported.    DIETARY INTAKE:  Usual eating pattern includes 1-2 meals and snacking on and off throughout the day. Typical snack foods include chips, crackers, soda.   Everyday foods include chips, crackers, soda.  Avoided foods include tomatoes.    24-hr recall:  B (10 AM): chicken biscuit from [employer] and cup of oatmeal, Mountain Dew 12 oz can  Snk ( AM):  None reported.  L (2-3 PM): [employer] Tray-cheeseburger, fries, chicken nuggets, ~32 oz Cheerwine  Snk ( PM): cheese crackers, 44 oz soda  D (630 PM): Sometimes skips dinner- if not usually has another [employer] tray-cheeseburger, fries, chicken nuggets, 32 oz Cheerwine  Snk ( PM): None reported.  Beverages: ~120 oz soda; at least 2-3 bottles of water.   Usual physical activity: None reported outside of regular work activity. Pt works at [employer] and [employer] school [employer].   Progress Towards Goal(s):  In progress.   Nutritional Diagnosis:  NI-5.11.1 Predicted suboptimal nutrient intake As  related to inconsistent eating pattern; high intake of sugar sweetened beverages .  As evidenced by pt's reported dietary recall and habits .    Intervention:  Nutrition counseling provided. Discussed pt's lab work. Dietitian provided education regarding the relationship between dietary intake and blood sugar/insulin resistance. Dietitian provided education on balanced nutrition and importance of having consistent eating pattern/not skipping meals. Dietitian provided education on benefits of physical activity on blood sugar control. Worked with pt to set starting goals. Dietitian recommended pt take vitamin D supplement as recommended by pt's doctor. Pt appeared agreeable to information/goals discussed.   Instructions/Goals:  Make sure to get in three meals per day. Try to have balanced meals like the My Plate example (see handout). Include lean proteins, vegetables, fruits, and whole grains at meals.    Goal: Have 3 meals each day/eat every 3-5 hours.    Starting Goal: Reduce soda intake to 12-24 oz per day. Include at least 64 oz water daily.  Recommend taking 4000-5000 IU vitamin D daily due to low vitamin D.   Make physical activity a part of your week. Try to include at least 30 minutes of physical activity 5 days each week or at least 150 minutes per week. Regular physical activity promotes overall health-including helping to reduce risk for heart disease and diabetes, promoting mental health, and helping us sleep better.    Teaching Method Utilized:  Visual Auditory  Handouts given during visit include:  Balanced plate and food list.  Balanced snack sheet.   Barriers to learning/adherence to lifestyle change: Heavy work schedule.   Demonstrated degree of understanding via:  Teach Back   Monitoring/Evaluation:  Dietary intake, exercise, and body weight in 2 month(s).

## 2019-02-25 ENCOUNTER — Encounter: Payer: Self-pay | Admitting: Registered"

## 2019-02-25 ENCOUNTER — Encounter: Payer: BC Managed Care – PPO | Attending: Family Medicine | Admitting: Registered"

## 2019-02-25 DIAGNOSIS — R7303 Prediabetes: Secondary | ICD-10-CM

## 2019-02-25 NOTE — Patient Instructions (Addendum)
Instructions/Goals:  Make sure to get in three meals per day. Try to have balanced meals like the My Plate example (see handout). Include lean proteins, vegetables, fruits, and whole grains at meals.    Goal: Continue with working to have 3 meals/eat every 3-5 hours   Goal: Continue working to include more vegetables, fruit, whole grains to help with constipation and supply needed nutrients.   Water Goal: Include at least 64 oz water daily. Great job cutting down soda intake! Continue working to reduce soda intake and increase water intake.    Goal: Try a lactose free milk such as Lactaid or soy milk to help get in adequate calcium without GI issues.    Goal: Purchase and start taking vitamin D supplement. Recommend taking 4000 IU vitamin D daily due to low vitamin D.   Make physical activity a part of your week. Try to include at least 30 minutes of physical activity 5 days each week or at least 150 minutes per week. Regular physical activity promotes overall health-including helping to reduce risk for heart disease and diabetes, promoting mental health, and helping [place] sleep better.

## 2019-02-25 NOTE — Progress Notes (Signed)
Medical Nutrition Therapy:  Appt start time: 1004 end time:  1034.  Assessment:  Primary concerns today: Pt referred due to prediabetes. Nutrition Follow-Up: Pt reports she hasn't progressed as much as she would have liked, but she has made some changes including eating more fruits and vegetables, drinking more water and consuming less soda and fast food. Reports she has cut down on breads but feels she has increased meats more so than fruits and vegetables. Pt reports cutting down soda to ~24 oz per or less per day, down from reported ~120 oz per day at last appointment. Reports she has no longer been getting shaky near as often since doing smaller, more frequent meals and not skipping as often as she was before. Reports she has started working toward being more active during her daily routine, but has not yet started including scheduled physical activity. Pt reports that she is getting ready to move and her new location will encourage her to be more active because she will not mind walking in her new neighborhood. Reports she does not feel safe to walk around in current neighborhood. Pt reports she plans to start going to the gym and taking 70 year old son with her. Plans to start doing this before work in the morning as she reports she is out of town each weekend so doing it on those days would not work. Pt reports having constipation. Reports that having an apple each day helps with constipation but if she does not have it she will be constipated. Reports she thinks she needs to drink a detox tea to help with constipation. Pt does not want to do any probiotics-reports that every time she eats foods with probiotics she has GI issues. Reports having some problems when drinking milk and eating ice cream. Pt reports that she forgot to purchase vitamin D supplement discussed at last appointment.   Noted Lab Values: 12/19 Hgb A1c: 6.0 Vitamin D: 18.4  Food Allergies/Intolerances: tomatoes. Suspected lactose  intolerance-pt reports bloating if she drinks milk, eats yogurt or ice cream. Can tolerate cheese and small amount of milk in cereal.   Preferred Learning Style:  No preference indicated   Learning Readiness:   Ready  MEDICATIONS: None reported.    DIETARY INTAKE:  Usual eating pattern includes 3 meals and 2-4 snacks. Typical snack foods include string cheese, crackers, apples, broccoli and ranch, pretzels and cheese dip.   Everyday foods include apple.  Avoided foods include tomatoes.    24-hr recall: Weekend Day B (AM): None reported.  Snk ( AM):  None reported.  L (1130-12 PM): [employer] sandwich-fried chicken on bun, [employer]bbie oatmeal cookie, water    Snk ( PM): 12 can soda  D (7 PM): 3 [employer], water Snk ( PM): None reported.  Beverages: 1-2 x 32 oz water daily; usually ~24 oz soda   Usual physical activity: Reports she has been trying to be more active throughout the day. Reports she pays for a gym membership through her work at [employer] and plans to start going with her son.   Progress Towards Goal(s):  Some progress.   Nutritional Diagnosis:  NI-5.11.1 Predicted suboptimal nutrient intake As related to inconsistent eating pattern; high intake of sugar sweetened beverages .  As evidenced by pt's reported dietary recall and habits .    Intervention:  Nutrition counseling provided. Praised pt's progress with working to have more consistent meal pattern, decrease soda intake, and increase intake of fruits and vegetables.  Discussed how beneficial cutting down on soda intake is for blood sugar management. Dietitian provided education regarding nutrition to help with constipation-discussed increasing fruits, vegetables, water, choosing whole grains and including physical activity. Discussed symptoms of lactose intolerance and recommended trying lactose free cow's milk or soy milk to help get in adequate dairy without GI symptoms. Recommended taking vitamin D supplement  daily. Discussed setting reminder on phone to purchase vitamin around time pt gets off at [facility] to help her remember to purchase. Pt appeared agreeable to information/goals discussed.   Instructions/Goals:  Make sure to get in three meals per day. Try to have balanced meals like the My Plate example (see handout). Include lean proteins, vegetables, fruits, and whole grains at meals.    Goal: Continue with working to have 3 meals/eat every 3-5 hours   Goal: Continue working to include more vegetables, fruit, whole grains to help with constipation and supply needed nutrients.   Water Goal: Include at least 64 oz water daily. Great job cutting down soda intake! Continue working to reduce soda intake and increase water intake.    Goal: Try a lactose free milk such as Lactaid or soy milk to help get in adequate calcium without GI issues.    Goal: Purchase and start taking vitamin D supplement. Recommend taking 4000 IU vitamin D daily due to low vitamin D.   Make physical activity a part of your week. Try to include at least 30 minutes of physical activity 5 days each week or at least 150 minutes per week. Regular physical activity promotes overall health-including helping to reduce risk for heart disease and diabetes, promoting mental health, and helping [place] sleep better.      Teaching Method Utilized:  Visual Auditory  Barriers to learning/adherence to lifestyle change: Heavy work schedule.   Demonstrated degree of understanding via:  Teach Back   Monitoring/Evaluation:  Dietary intake, exercise, and body weight in 2 month(s).

## 2019-04-29 ENCOUNTER — Ambulatory Visit: Payer: BC Managed Care – PPO | Admitting: Registered"

## 2019-09-20 ENCOUNTER — Other Ambulatory Visit: Payer: Self-pay

## 2019-09-20 DIAGNOSIS — Z20822 Contact with and (suspected) exposure to covid-19: Secondary | ICD-10-CM

## 2019-09-21 LAB — NOVEL CORONAVIRUS, NAA: SARS-CoV-2, NAA: NOT DETECTED

## 2022-02-17 ENCOUNTER — Other Ambulatory Visit: Payer: Self-pay | Admitting: Family Medicine

## 2022-02-17 DIAGNOSIS — Z1231 Encounter for screening mammogram for malignant neoplasm of breast: Secondary | ICD-10-CM

## 2022-04-06 ENCOUNTER — Ambulatory Visit: Payer: BC Managed Care – PPO

## 2022-04-08 ENCOUNTER — Ambulatory Visit
Admission: RE | Admit: 2022-04-08 | Discharge: 2022-04-08 | Disposition: A | Discharge status: Home / Self Care | Payer: BC Managed Care – PPO | Source: Ambulatory Visit | Attending: Family Medicine | Admitting: Family Medicine

## 2022-04-08 ENCOUNTER — Ambulatory Visit: Payer: BC Managed Care – PPO

## 2022-04-08 DIAGNOSIS — Z1231 Encounter for screening mammogram for malignant neoplasm of breast: Secondary | ICD-10-CM

## 2022-04-11 ENCOUNTER — Other Ambulatory Visit: Payer: Self-pay | Admitting: Family Medicine

## 2022-04-11 DIAGNOSIS — R928 Other abnormal and inconclusive findings on diagnostic imaging of breast: Secondary | ICD-10-CM

## 2022-04-29 ENCOUNTER — Other Ambulatory Visit: Payer: BC Managed Care – PPO

## 2022-05-13 ENCOUNTER — Ambulatory Visit: Payer: BC Managed Care – PPO

## 2022-05-14 ENCOUNTER — Ambulatory Visit
Admission: RE | Admit: 2022-05-14 | Discharge: 2022-05-14 | Disposition: A | Discharge status: Home / Self Care | Payer: BC Managed Care – PPO | Source: Ambulatory Visit | Attending: Family Medicine | Admitting: Family Medicine

## 2022-05-14 ENCOUNTER — Ambulatory Visit: Payer: BC Managed Care – PPO

## 2022-05-14 DIAGNOSIS — R928 Other abnormal and inconclusive findings on diagnostic imaging of breast: Secondary | ICD-10-CM

## 2023-06-04 IMAGING — MG MM DIGITAL DIAGNOSTIC UNILAT*L* W/ TOMO W/ CAD
6 series · 6 of 18 positions shown · non-contrast
Comparison: 04/08/2022

CLINICAL DATA: Patient returns after baseline screening for
evaluation possible LEFT breast asymmetry.

EXAM:
DIGITAL DIAGNOSTIC UNILATERAL LEFT MAMMOGRAM WITH TOMOSYNTHESIS AND
CAD
TECHNIQUE: Left digital diagnostic mammography and breast tomosynthesis was
performed. The images were evaluated with computer-aided detection.

[L MLO synth-2D]
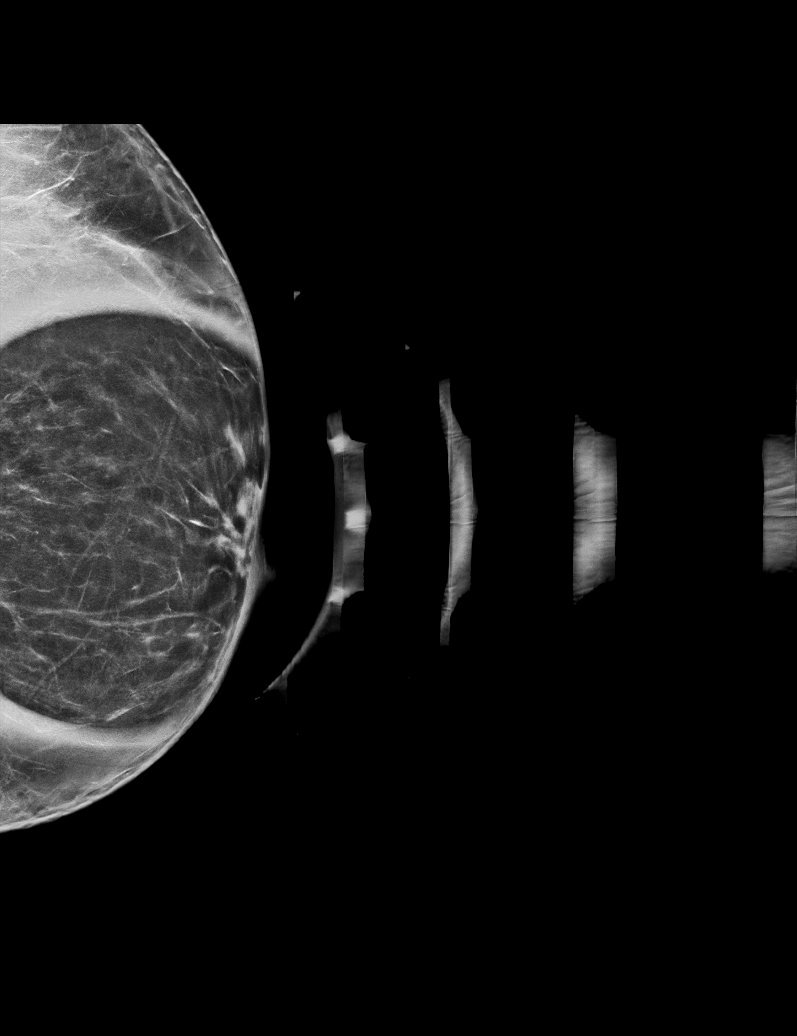

[L CC synth-2D (1 of 2)]
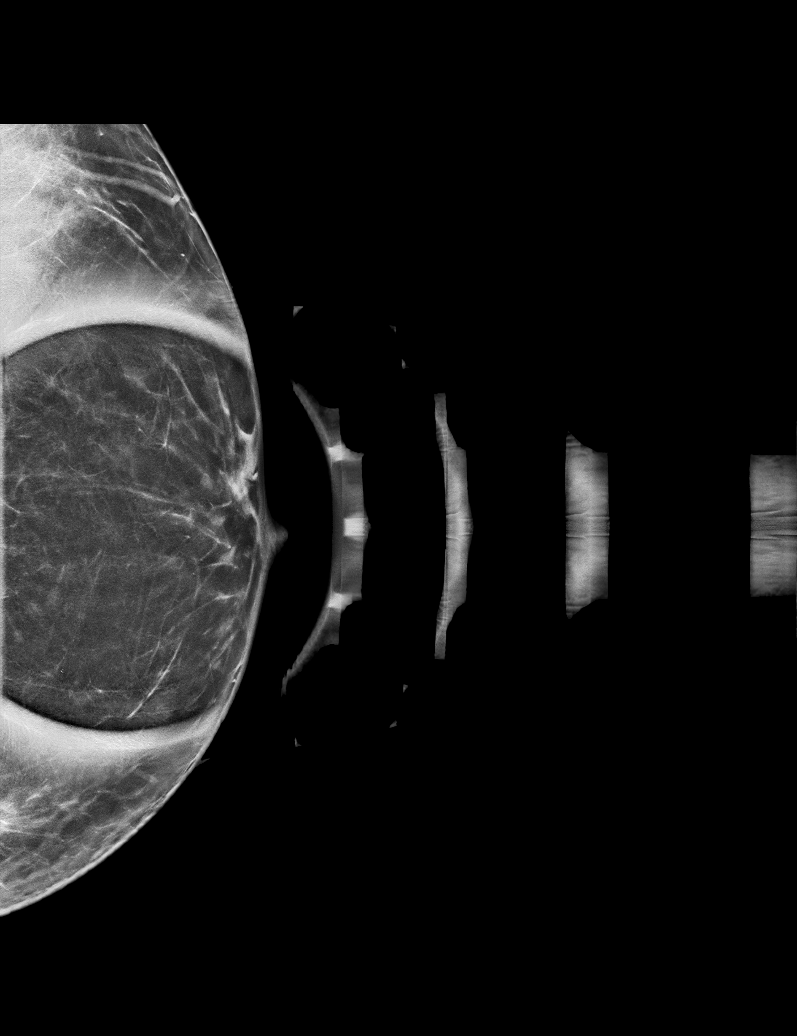

[L CC synth-2D (2 of 2)]
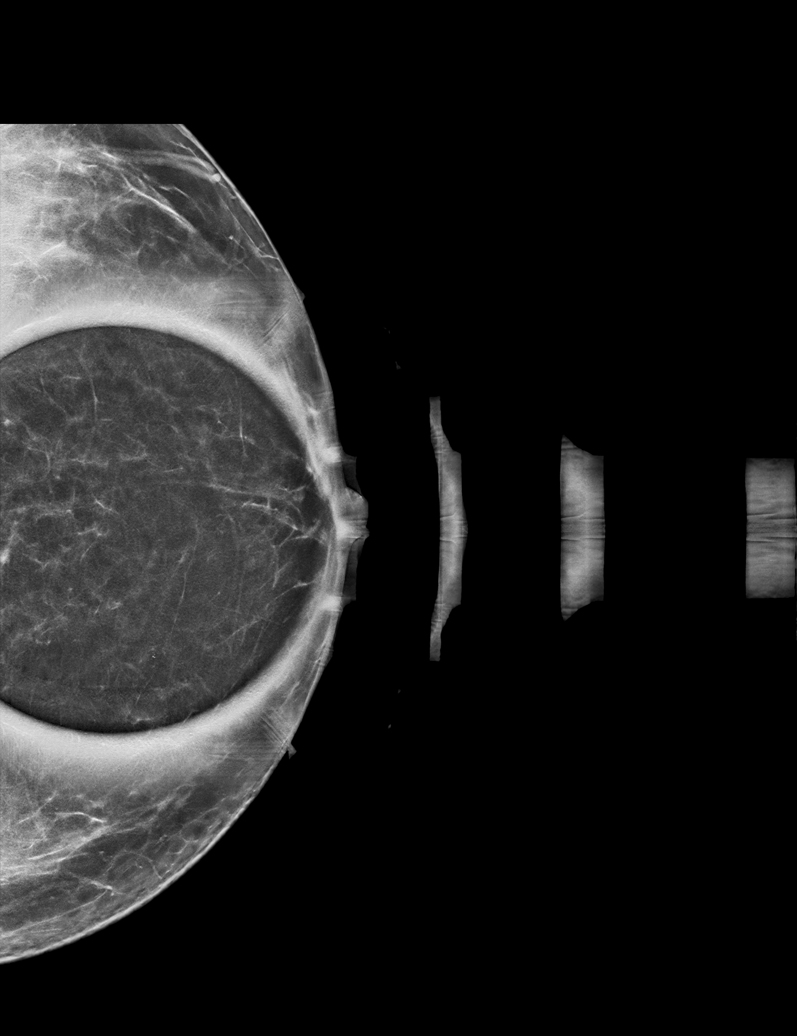

[L CC tomo (1 of 2) · tomo slice 31/61.0]
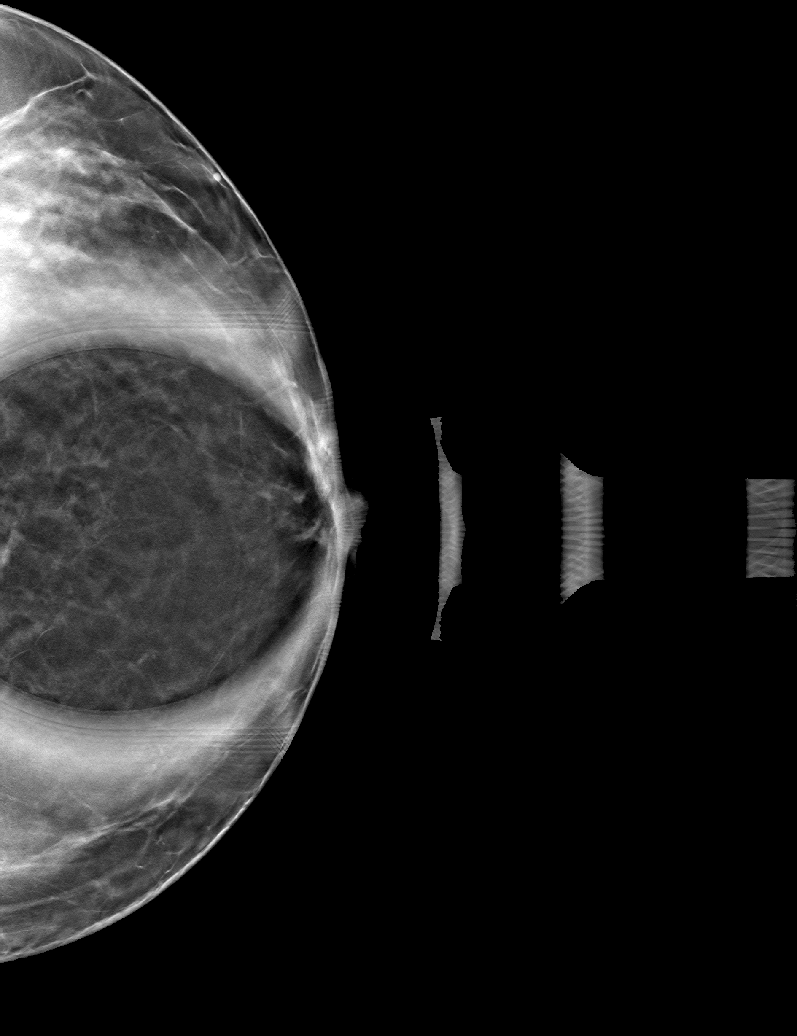

[L CC tomo (2 of 2) · tomo slice 31/61.0]
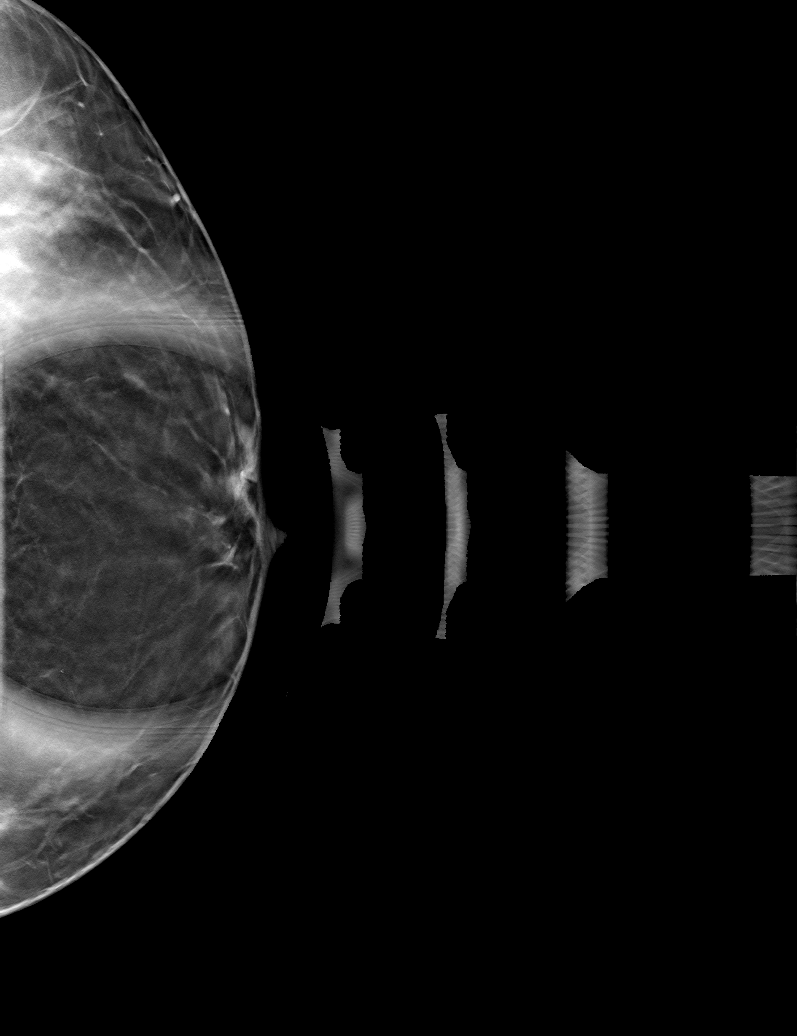

[L MLO tomo · tomo slice 37/72.0]
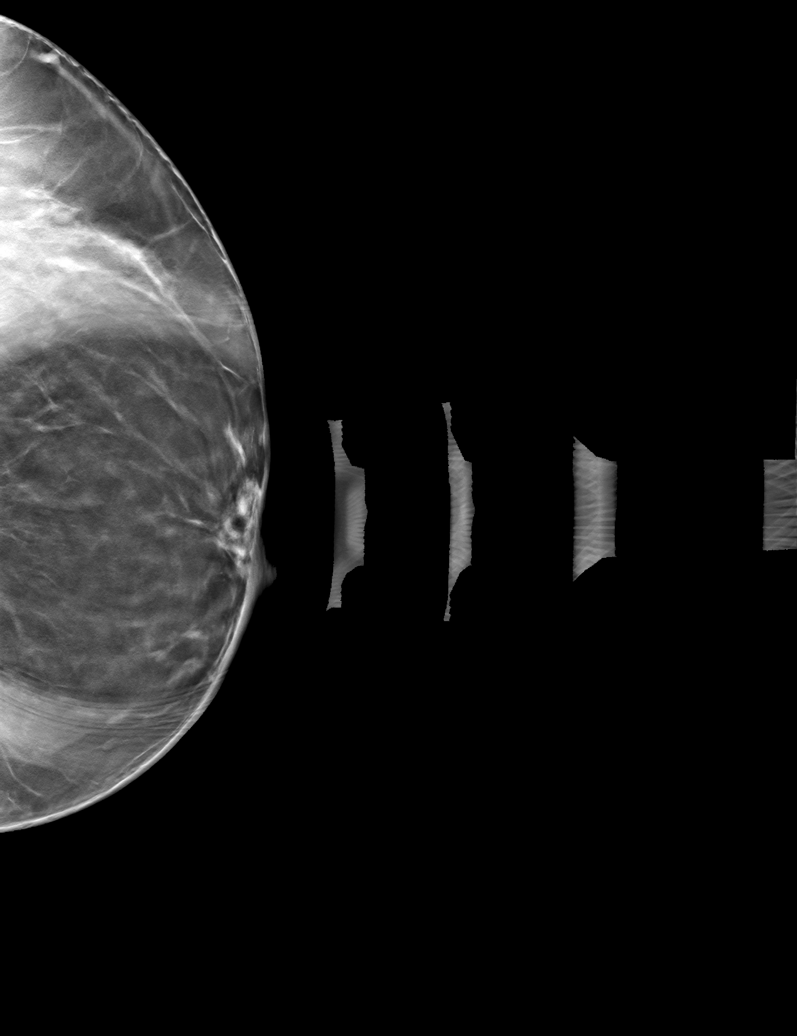

[6 of 18 positions shown; findings below may reference images not displayed]

ACR Breast Density Category b: There are scattered areas of
fibroglandular density.
FINDINGS: Additional 2-D and 3-D images are performed. These views show no
persistent asymmetry in the retroareolar region of the LEFT breast.
No suspicious mass, distortion, or microcalcifications are
identified to suggest presence of malignancy.
IMPRESSION: No mammographic evidence for malignancy.

RECOMMENDATION:
Screening mammogram in one year.(Code:[id number])

I have discussed the findings and recommendations with the patient.
If applicable, a reminder letter will be sent to the patient
regarding the next appointment.

BI-RADS CATEGORY  1: Negative.
